# Patient Record
Sex: Female | Born: 1975 | Race: White | Hispanic: No | Marital: Married | Smoking: Current every day smoker
Health system: Southern US, Community
[De-identification: ages and names within clinical notes are randomized; demographics above are authoritative.]

## PROBLEM LIST (undated history)

## (undated) DIAGNOSIS — T7840XA Allergy, unspecified, initial encounter: Secondary | ICD-10-CM

## (undated) DIAGNOSIS — M329 Systemic lupus erythematosus, unspecified: Secondary | ICD-10-CM

## (undated) DIAGNOSIS — K760 Fatty (change of) liver, not elsewhere classified: Secondary | ICD-10-CM

## (undated) DIAGNOSIS — IMO0002 Reserved for concepts with insufficient information to code with codable children: Secondary | ICD-10-CM

## (undated) HISTORY — DX: Reserved for concepts with insufficient information to code with codable children: IMO0002

## (undated) HISTORY — PX: CARDIAC VALVE REPLACEMENT: SHX585

## (undated) HISTORY — DX: Systemic lupus erythematosus, unspecified: M32.9

## (undated) HISTORY — DX: Allergy, unspecified, initial encounter: T78.40XA

## (undated) HISTORY — DX: Fatty (change of) liver, not elsewhere classified: K76.0

---

## 1995-06-02 HISTORY — PX: SPLENECTOMY, TOTAL: SHX788

## 2004-05-29 ENCOUNTER — Emergency Department: Payer: Self-pay | Admitting: Emergency Medicine

## 2004-06-01 HISTORY — PX: ABDOMINAL HYSTERECTOMY: SHX81

## 2005-09-09 ENCOUNTER — Emergency Department: Payer: Self-pay | Admitting: Emergency Medicine

## 2006-02-11 ENCOUNTER — Emergency Department: Payer: Self-pay | Admitting: Emergency Medicine

## 2006-03-30 ENCOUNTER — Inpatient Hospital Stay: Payer: Self-pay

## 2006-04-07 ENCOUNTER — Ambulatory Visit: Payer: Self-pay | Admitting: Obstetrics & Gynecology

## 2006-07-18 ENCOUNTER — Emergency Department: Payer: Self-pay | Admitting: Emergency Medicine

## 2007-01-20 ENCOUNTER — Emergency Department: Payer: Self-pay | Admitting: Emergency Medicine

## 2007-01-20 ENCOUNTER — Other Ambulatory Visit: Payer: Self-pay

## 2007-03-21 ENCOUNTER — Emergency Department: Payer: Self-pay | Admitting: Emergency Medicine

## 2008-07-14 ENCOUNTER — Emergency Department: Payer: Self-pay | Admitting: Emergency Medicine

## 2008-07-16 ENCOUNTER — Emergency Department: Payer: Self-pay | Admitting: Emergency Medicine

## 2009-04-06 ENCOUNTER — Emergency Department: Payer: Self-pay | Admitting: Emergency Medicine

## 2009-11-24 ENCOUNTER — Ambulatory Visit: Payer: Self-pay | Admitting: Orthopedic Surgery

## 2014-07-03 LAB — LIPID PANEL
Cholesterol: 175 mg/dL (ref 0–200)
HDL: 84 mg/dL — AB (ref 35–70)
LDL Cholesterol: 79 mg/dL
Triglycerides: 62 mg/dL (ref 40–160)

## 2014-07-03 LAB — HEMOGLOBIN A1C: Hgb A1c MFr Bld: 5.4 % (ref 4.0–6.0)

## 2014-07-03 LAB — CBC AND DIFFERENTIAL
Platelets: 592 10*3/uL — AB (ref 150–399)
WBC: 11.7 10^3/mL

## 2014-07-06 ENCOUNTER — Ambulatory Visit: Payer: Self-pay | Admitting: Family Medicine

## 2014-09-18 ENCOUNTER — Encounter: Payer: Self-pay | Admitting: Family Medicine

## 2014-09-18 DIAGNOSIS — F1010 Alcohol abuse, uncomplicated: Secondary | ICD-10-CM | POA: Insufficient documentation

## 2014-09-18 DIAGNOSIS — Z800 Family history of malignant neoplasm of digestive organs: Secondary | ICD-10-CM | POA: Insufficient documentation

## 2014-09-18 DIAGNOSIS — E663 Overweight: Secondary | ICD-10-CM | POA: Insufficient documentation

## 2014-09-18 DIAGNOSIS — Z833 Family history of diabetes mellitus: Secondary | ICD-10-CM | POA: Insufficient documentation

## 2014-09-18 DIAGNOSIS — F17200 Nicotine dependence, unspecified, uncomplicated: Secondary | ICD-10-CM | POA: Insufficient documentation

## 2014-09-18 DIAGNOSIS — Z8709 Personal history of other diseases of the respiratory system: Secondary | ICD-10-CM | POA: Insufficient documentation

## 2014-09-18 DIAGNOSIS — K760 Fatty (change of) liver, not elsewhere classified: Secondary | ICD-10-CM | POA: Insufficient documentation

## 2014-11-19 ENCOUNTER — Encounter: Payer: Self-pay | Admitting: Family Medicine

## 2014-11-19 ENCOUNTER — Ambulatory Visit (INDEPENDENT_AMBULATORY_CARE_PROVIDER_SITE_OTHER): Payer: BLUE CROSS/BLUE SHIELD | Admitting: Family Medicine

## 2014-11-19 VITALS — BP 110/76 | HR 70 | Temp 98.3°F | Ht 65.0 in | Wt 182.4 lb

## 2014-11-19 DIAGNOSIS — R5383 Other fatigue: Secondary | ICD-10-CM | POA: Insufficient documentation

## 2014-11-19 DIAGNOSIS — J029 Acute pharyngitis, unspecified: Secondary | ICD-10-CM | POA: Diagnosis not present

## 2014-11-19 DIAGNOSIS — K760 Fatty (change of) liver, not elsewhere classified: Secondary | ICD-10-CM | POA: Diagnosis not present

## 2014-11-19 DIAGNOSIS — Z720 Tobacco use: Secondary | ICD-10-CM

## 2014-11-19 DIAGNOSIS — F17200 Nicotine dependence, unspecified, uncomplicated: Secondary | ICD-10-CM

## 2014-11-19 DIAGNOSIS — F1010 Alcohol abuse, uncomplicated: Secondary | ICD-10-CM | POA: Diagnosis not present

## 2014-11-19 DIAGNOSIS — E538 Deficiency of other specified B group vitamins: Secondary | ICD-10-CM | POA: Insufficient documentation

## 2014-11-19 DIAGNOSIS — R5382 Chronic fatigue, unspecified: Secondary | ICD-10-CM | POA: Diagnosis not present

## 2014-11-19 DIAGNOSIS — D72829 Elevated white blood cell count, unspecified: Secondary | ICD-10-CM

## 2014-11-19 DIAGNOSIS — E663 Overweight: Secondary | ICD-10-CM

## 2014-11-19 NOTE — Progress Notes (Signed)
Date:  11/19/2014   Name:  Victoria [name]   DOB:  05/28/1976   MRN:  [id number]  PCP:  Adline Potter, MD    Chief Complaint: Sore Throat   History of Present Illness:  This is a 39 y.o. female c/o sore throat past two days with fever and myalgias, slight rhinorrhea, hx strep. Quit ETOH for 11 days but started back, not drinking as much as before. Weight up 4#. B12 low last visit, taking supplement most days. Still smoking, not interested in quitting at present. C/o chronic fatigue, unsure why, sleeping ok.  Review of Systems:  Review of Systems  Patient Active Problem List   Diagnosis Date Noted  . Fatigue 11/19/2014  . Leukocytosis 11/19/2014  . Vitamin B12 deficiency 11/19/2014  . AA (alcohol abuse) 09/18/2014  . History of hay fever 09/18/2014  . Fatty infiltration of liver 09/18/2014  . Family history of malignant neoplasm of pancreas 09/18/2014  . Family history of diabetes mellitus type II 09/18/2014  . Overweight 09/18/2014  . Current smoker 09/18/2014    Prior to Admission medications   Medication Sig Start Date End Date Taking? Authorizing Provider  diphenhydrAMINE (BENADRYL) 25 mg capsule Take 1 capsule by mouth every 6 (six) hours as needed.   Yes Historical Provider, MD    Allergies  Allergen Reactions  . Oxycodone Hcl     No past surgical history on file.  History  Substance Use Topics  . Smoking status: Current Every Day Smoker  . Smokeless tobacco: Not on file     Comment: patient not ready to quit  . Alcohol Use: 0.0 oz/week    0 Standard drinks or equivalent per week    No family history on file.  Medication list has been reviewed and updated.  Physical Examination: BP 110/76 mmHg  Pulse 70  Temp(Src) 98.3 F (36.8 C)  Ht 5\' 5"  (1.651 m)  Wt 182 lb 6.4 oz (82.736 kg)  BMI 30.35 kg/m2  SpO2 99%  Physical Exam  Constitutional: She is oriented to person, place, and time. She appears well-developed and well-nourished. No distress.  HENT:   OP mild erythema, no exudate  Cardiovascular: Normal rate, regular rhythm and normal heart sounds.  Exam reveals no gallop.   No murmur heard. Pulmonary/Chest: Effort normal and breath sounds normal. She has no wheezes. She has no rales.  Abdominal: Soft. She exhibits no distension and no mass. There is no tenderness.  Lymphadenopathy:    She has no cervical adenopathy.  Neurological: She is alert and oriented to person, place, and time. Coordination normal.  Skin: Skin is warm and dry.  Psychiatric: She has a normal mood and affect. Her behavior is normal.    Assessment and Plan:  1. Pharyngitis RST negative, likely viral, expect resolution 3-5 days - POCT rapid strep A  2. Fatty infiltration of liver Recheck labs - Comprehensive Metabolic Panel (CMET)  3. AA (alcohol abuse) Encouraged full abstinence - Vitamin B1  4. Overweight Check labs, encouraged increased exercise, weight loss - TSH - Vitamin D (25 hydroxy)  5. Current smoker Advised quitting  6. Chronic fatigue Check labs - TSH - CBC - Vitamin B12  7. Leukocytosis Unclear etiology, sl improved last labs 2/16, recheck today - CBC  8. Vitamin B12 deficiency Recheck on supplementation - Vitamin B12  Return in about 4 weeks (around 12/17/2014).  [name]. [facility]  11/19/2014

## 2014-11-20 LAB — COMPREHENSIVE METABOLIC PANEL
A/G RATIO: 1.7 (ref 1.1–2.5)
ALBUMIN: 4.2 g/dL (ref 3.5–5.5)
ALK PHOS: 81 IU/L (ref 39–117)
ALT: 15 IU/L (ref 0–32)
AST: 19 IU/L (ref 0–40)
BUN / CREAT RATIO: 16 (ref 9–23)
BUN: 9 mg/dL (ref 6–24)
Bilirubin Total: 0.3 mg/dL (ref 0.0–1.2)
CO2: 20 mmol/L (ref 18–29)
Calcium: 9.1 mg/dL (ref 8.7–10.2)
Chloride: 99 mmol/L (ref 97–108)
Creatinine, Ser: 0.57 mg/dL (ref 0.57–1.00)
GFR calc Af Amer: 133 mL/min/{1.73_m2} (ref >59)
GFR, EST NON AFRICAN AMERICAN: 116 mL/min/{1.73_m2} (ref >59)
GLOBULIN, TOTAL: 2.5 g/dL (ref 1.5–4.5)
Glucose: 76 mg/dL (ref 65–99)
Potassium: 4.8 mmol/L (ref 3.5–5.2)
SODIUM: 138 mmol/L (ref 134–144)
Total Protein: 6.7 g/dL (ref 6.0–8.5)

## 2014-11-20 LAB — VITAMIN B12: Vitamin B-12: 262 pg/mL (ref 211–946)

## 2014-11-20 LAB — CBC
HEMATOCRIT: 44.1 % (ref 34.0–46.6)
Hemoglobin: 14.7 g/dL (ref 11.1–15.9)
MCH: 33.1 pg — AB (ref 26.6–33.0)
MCHC: 33.3 g/dL (ref 31.5–35.7)
MCV: 99 fL — AB (ref 79–97)
Platelets: 561 10*3/uL — ABNORMAL HIGH (ref 150–379)
RBC: 4.44 x10E6/uL (ref 3.77–5.28)
RDW: 13 % (ref 12.3–15.4)
WBC: 11.6 10*3/uL — ABNORMAL HIGH (ref 3.4–10.8)

## 2014-11-20 LAB — TSH: TSH: 2.04 u[IU]/mL (ref 0.450–4.500)

## 2014-11-21 ENCOUNTER — Other Ambulatory Visit: Payer: Self-pay | Admitting: Family Medicine

## 2014-11-21 LAB — VITAMIN B12: Vitamin B-12: 152

## 2014-11-22 LAB — VITAMIN D 25 HYDROXY (VIT D DEFICIENCY, FRACTURES): Vit D, 25-Hydroxy: 34.7 ng/mL (ref 30.0–100.0)

## 2014-11-23 LAB — SPECIMEN STATUS REPORT

## 2017-10-07 ENCOUNTER — Ambulatory Visit: Payer: BLUE CROSS/BLUE SHIELD | Admitting: Physician Assistant

## 2017-10-07 ENCOUNTER — Encounter: Payer: Self-pay | Admitting: Physician Assistant

## 2017-10-07 VITALS — BP 110/82 | HR 78 | Temp 98.3°F | Resp 16 | Ht 65.0 in | Wt 166.0 lb

## 2017-10-07 DIAGNOSIS — F1010 Alcohol abuse, uncomplicated: Secondary | ICD-10-CM

## 2017-10-07 DIAGNOSIS — E538 Deficiency of other specified B group vitamins: Secondary | ICD-10-CM

## 2017-10-07 DIAGNOSIS — R410 Disorientation, unspecified: Secondary | ICD-10-CM

## 2017-10-07 DIAGNOSIS — F17200 Nicotine dependence, unspecified, uncomplicated: Secondary | ICD-10-CM | POA: Diagnosis not present

## 2017-10-07 DIAGNOSIS — N951 Menopausal and female climacteric states: Secondary | ICD-10-CM | POA: Diagnosis not present

## 2017-10-07 DIAGNOSIS — Z833 Family history of diabetes mellitus: Secondary | ICD-10-CM

## 2017-10-07 DIAGNOSIS — M329 Systemic lupus erythematosus, unspecified: Secondary | ICD-10-CM | POA: Diagnosis not present

## 2017-10-07 DIAGNOSIS — IMO0002 Reserved for concepts with insufficient information to code with codable children: Secondary | ICD-10-CM | POA: Insufficient documentation

## 2017-10-07 DIAGNOSIS — Z90710 Acquired absence of both cervix and uterus: Secondary | ICD-10-CM | POA: Diagnosis not present

## 2017-10-07 DIAGNOSIS — K760 Fatty (change of) liver, not elsewhere classified: Secondary | ICD-10-CM | POA: Diagnosis not present

## 2017-10-07 NOTE — Progress Notes (Signed)
Patient: Victoria [name], Female    DOB: 10-26-1975, 42 y.o.   MRN: [id number] Visit Date: 10/11/2017  Today's Provider: Mar Daring, [name]   Chief Complaint  Patient presents with  . New Patient (Initial Visit)   Subjective:   Patient here to establish care, pt last seen by Dr. Vicente Masson, notes in [place].   Patient reports she was diagnosed with Lupus by Dr. Aubery Lapping in 12/2016. Patient reports decreased concentration x's several months report symptoms are worsening.  She also has a history of mild fatty liver noted on US abdomen from 07/06/14. She reports at the time she was having RUQ pain and they wanted to R/O gallstones.   -----------------------------------------------------------------   Review of Systems  Constitutional: Positive for fatigue.  HENT: Positive for congestion and sinus pressure.   Eyes: Positive for photophobia.  Respiratory: Positive for cough and shortness of breath.   Gastrointestinal: Positive for nausea.  Endocrine: Negative.   Genitourinary: Negative.   Musculoskeletal: Positive for arthralgias.  Skin: Negative.   Allergic/Immunologic: Negative.   Neurological: Positive for light-headedness and numbness.  Psychiatric/Behavioral: Positive for confusion and decreased concentration.    Social History      She  reports that she has been smoking cigarettes.  She has a 27.00 pack-year smoking history. She has never used smokeless tobacco. She reports that she drinks about 6.0 oz of alcohol per week. She reports that she does not use drugs.       Social History   Socioeconomic History  . Marital status: Married    Spouse name: Not on file  . Number of children: Not on file  . Years of education: Not on file  . Highest education level: Not on file  Occupational History  . Not on file  Social Needs  . Financial resource strain: Not on file  . Food insecurity:    Worry: Not on file    Inability: Not on file  .  Transportation needs:    Medical: Not on file    Non-medical: Not on file  Tobacco Use  . Smoking status: Current Every Day Smoker    Packs/day: 1.00    Years: 27.00    Pack years: 27.00    Types: Cigarettes  . Smokeless tobacco: Never Used  . Tobacco comment: patient not ready to quit  Substance and Sexual Activity  . Alcohol use: Yes    Alcohol/week: 6.0 oz    Types: 10 Glasses of wine per week  . Drug use: No  . Sexual activity: Not on file  Lifestyle  . Physical activity:    Days per week: Not on file    Minutes per session: Not on file  . Stress: Not on file  Relationships  . Social connections:    Talks on phone: Not on file    Gets together: Not on file    Attends religious service: Not on file    Active member of club or organization: Not on file    Attends meetings of clubs or organizations: Not on file    Relationship status: Not on file  Other Topics Concern  . Not on file  Social History Narrative  . Not on file    Past Medical History:  Diagnosis Date  . Allergy   . Fatty liver   . Lupus [facility])      Patient Active Problem List   Diagnosis Date Noted  . Lupus ([place]) 10/07/2017  . Fatigue 11/19/2014  .  Leukocytosis 11/19/2014  . Vitamin B12 deficiency 11/19/2014  . AA (alcohol abuse) 09/18/2014  . History of hay fever 09/18/2014  . Fatty infiltration of liver 09/18/2014  . Family history of malignant neoplasm of pancreas 09/18/2014  . Family history of diabetes mellitus type II 09/18/2014  . Overweight 09/18/2014  . Current smoker 09/18/2014    Past Surgical History:  Procedure Laterality Date  . ABDOMINAL HYSTERECTOMY  2006   still has ovaries  . CARDIAC VALVE REPLACEMENT    . SPLENECTOMY, TOTAL  1997    Family History        Family Status  Relation Name Status  . Mother  Alive  . Father  Deceased at age 42  . MGM  (Not Specified)  . PGM  (Not Specified)  . PGF  (Not Specified)  . Brother  Alive        Her family history includes  Diabetes in her maternal grandmother and mother; Heart disease in her maternal grandmother, paternal grandfather, and paternal grandmother; Hypertension in her mother; Liver disease in her brother; Pancreatic cancer in her father.      Allergies  Allergen Reactions  . Oxycodone Hcl      Current Outpatient Medications:  .  diphenhydrAMINE (BENADRYL) 25 mg capsule, Take 1 capsule by mouth every 6 (six) hours as needed., Disp: , Rfl:    Patient Care Team: Mar Daring, [name] as PCP - General (Family Medicine)      Objective:   Vitals: BP 110/82 (BP Location: Left Arm, Patient Position: Sitting, Cuff Size: Normal)   Pulse 78   Temp 98.3 F (36.8 C) (Oral)   Resp 16   Ht 5\' 5"  (1.651 m)   Wt 166 lb (75.3 kg)   SpO2 99%   BMI 27.62 kg/m    Vitals:   10/07/17 1029  BP: 110/82  Pulse: 78  Resp: 16  Temp: 98.3 F (36.8 C)  TempSrc: Oral  SpO2: 99%  Weight: 166 lb (75.3 kg)  Height: 5\' 5"  (1.651 m)     Physical Exam  Constitutional: She is oriented to person, place, and time. She appears well-developed and well-nourished. No distress.  HENT:  Head: Normocephalic and atraumatic.  Right Ear: External ear normal.  Left Ear: External ear normal.  Nose: Nose normal.  Mouth/Throat: Oropharynx is clear and moist. No oropharyngeal exudate.  Eyes: Pupils are equal, round, and reactive to light. Conjunctivae and EOM are normal. Right eye exhibits no discharge. Left eye exhibits no discharge. No scleral icterus.  Neck: Normal range of motion. Neck supple. No JVD present. No tracheal deviation present. No thyromegaly present.  Cardiovascular: Normal rate, regular rhythm, normal heart sounds and intact distal pulses. Exam reveals no gallop and no friction rub.  No murmur heard. Pulmonary/Chest: Effort normal and breath sounds normal. No respiratory distress. She has no wheezes. She has no rales. She exhibits no tenderness.  Abdominal: Soft. Bowel sounds are normal. She exhibits  no distension and no mass. There is no tenderness. There is no rebound and no guarding.  Musculoskeletal: Normal range of motion. She exhibits no edema or tenderness.  Lymphadenopathy:    She has no cervical adenopathy.  Neurological: She is alert and oriented to person, place, and time.  Skin: Skin is warm and dry. No rash noted. She is not diaphoretic.  Psychiatric: She has a normal mood and affect. Her behavior is normal. Judgment and thought content normal.  Vitals reviewed.    Depression Screen [facility] 2/9  Scores 10/07/2017  PHQ - 2 Score 1  PHQ- 9 Score 5      Assessment & Plan:     Routine Health Maintenance and Physical Exam  Exercise Activities and Dietary recommendations Goals    None      Immunization History  Administered Date(s) Administered  . DTaP 07/03/2014  . Tdap 07/03/2014    Health Maintenance  Topic Date Due  . HIV Screening  06/10/1988  . PAP SMEAR  06/10/1994  . INFLUENZA VACCINE  12/30/2017  . TETANUS/TDAP  07/03/2024     Discussed health benefits of physical activity, and encouraged her to engage in regular exercise appropriate for her age and condition.    1. Fatty infiltration of liver Will recheck labs as they had not been checked since 2016. Also discussed with patient dietary changes that will help slow progression of fatty liver disease. Patient also continues to drink alcohol , 3-4 glasses of wine nightly. Discussed limiting alcohol consumption as well. I will check labs and f/u pending results.  - Comprehensive Metabolic Panel (CMET) - Lipid Profile  2. AA (alcohol abuse) See above medical treatment plan. - Comprehensive Metabolic Panel (CMET)  3. Family history of diabetes mellitus type II Strong family history. Will check labs as below for screening purposes.  - HgB A1c  4. Current smoker Discussed smoking cessation for approx 5-7 minutes during visit. She is interested but is currently using nicotene gum prn.  5. Vitamin B12  deficiency H/O this. Last check was normal, but low (less than 300). Will recheck to see where she is currently as this could cause some of her symptoms she is experiencing. - B12  6. Menopausal and female climacteric states Patient had hysterectomy. Having some menopausal symptoms. Will check labs and see if she may be menopausal.  - FSH/LH - Estrogens, total  7. S/P hysterectomy See above medical treatment plan. - FSH/LH - Estrogens, total  8. Confusion See above medical treatment plan. - CBC w/Diff/Platelet - Comprehensive Metabolic Panel (CMET) - TSH  9. Lupus ([place]) Will check labs and once results received from Dr. Phillip Heal will refer to Rheumatology. - CBC w/Diff/Platelet - Comprehensive Metabolic Panel (CMET)  --------------------------------------------------------------------    Mar Daring, [name]  [facility]

## 2017-10-07 NOTE — Patient Instructions (Signed)
Systemic Lupus Erythematosus, Adult Systemic lupus erythematosus is a long-term (chronic) disease that can affect many parts of the body. It can damage the skin, joints, blood vessels, brain, kidneys, lungs, heart, and other internal organs. It causes pain, irritation, and inflammation. Systemic lupus erythematosus is an autoimmune disease. With this type of disease, the body's defense system (immune system) mistakenly attacks normal tissues instead of attacking germs or abnormal growths. What are the causes? The cause of this condition is not known. What increases the risk? This condition is more likely to develop in:  Females.  People of Asian descent.  People of African-American descent.  People who have a family history of the condition.  What are the signs or symptoms? General symptoms include:  Joint pain and swelling (common).  Fever.  Fatigue.  Unusual weight loss or weight gain.  Skin rashes, especially over the nose and cheeks (butterfly rash) and after sun exposure.  Sores inside the mouth or nose.  Other symptoms depend on which parts of the body are affected. They can include:  Shortness of breath.  Chest pain.  Frequent urination.  Blood in the urine.  Seizures.  Mental changes.  Hair loss.  Swollen and tender lymph nodes.  Swelling of the hands or feet.  Symptoms can come and go. A period of time when symptoms get worse or come back is called a flare. A period of time with no symptoms is called a remission. How is this diagnosed? This condition is diagnosed based on symptoms, a medical history, and a physical exam. You may also have tests, including:  Blood tests.  Urine tests.  A chest X-ray.  A skin or kidney biopsy. For this test, a sample of tissue is taken from the skin or kidney and studied under a microscope.  You may be referred to an autoimmune disease specialist (rheumatologist). How is this treated? There is no cure for this  condition, but treatment can keep the disease in remission, help to control symptoms, and prevent damage to the heart, lungs, kidneys, and other organs. Treatment may involve taking a combination of medicines over time. Follow these instructions at home: Medicines  Take medicines only as directed by your health care provider.  Do not take any medicines that contain estrogen without first checking with your health care provider. Estrogen can trigger flares and may increase your risk for blood clots. Lifestyle  Eat a heart-healthy diet.  Stay active as directed by your health care provider.  Do not smoke. If you need help quitting, ask your health care provider.  Protect your skin from the sun by applying sunblock and wearing protective hats and clothing.  Learn as much as you can about your condition and have a good support system in place. Support may come from family, friends, or a lupus support group. General instructions  Keep all follow-up visits as directed by your health care provider. This is important.  Work closely with all of your health care providers to manage your condition.  Let your health care provider know right away if you become pregnant or if you plan to become pregnant. Pregnancy in women with this condition is considered high risk. Contact a health care provider if:  You have a fever.  Your symptoms flare.  You develop new symptoms.  You develop swollen feet or hands.  You develop puffiness around your eyes.  Your medicines are not working.  You have bloody, foamy, or coffee-colored urine.  There are changes in your   urination. For example, you urinate more often at night.  You think that you may be depressed or have anxiety. Get help right away if:  You have chest pain.  You have trouble breathing.  You have a seizure.  You suddenly get a very bad headache.  You suddenly develop facial or body weakness.  You cannot speak.  You cannot  understand speech. This information is not intended to replace advice given to you by your health care provider. Make sure you discuss any questions you have with your health care provider. Document Released: 05/08/2002 Document Revised: 01/12/2016 Document Reviewed: 04/25/2014 Elsevier Interactive Patient Education  2018 Elsevier Inc.  

## 2017-10-13 LAB — FSH/LH
FSH: 27.5 m[IU]/mL
LH: 27.1 m[IU]/mL

## 2017-10-13 LAB — COMPREHENSIVE METABOLIC PANEL
A/G RATIO: 2 (ref 1.2–2.2)
ALT: 22 IU/L (ref 0–32)
AST: 18 IU/L (ref 0–40)
Albumin: 4.2 g/dL (ref 3.5–5.5)
Alkaline Phosphatase: 48 IU/L (ref 39–117)
BUN/Creatinine Ratio: 19 (ref 9–23)
BUN: 12 mg/dL (ref 6–24)
Bilirubin Total: <0.2 mg/dL (ref 0.0–1.2)
CALCIUM: 9.3 mg/dL (ref 8.7–10.2)
CO2: 18 mmol/L — AB (ref 20–29)
CREATININE: 0.63 mg/dL (ref 0.57–1.00)
Chloride: 108 mmol/L — ABNORMAL HIGH (ref 96–106)
GFR, EST AFRICAN AMERICAN: 126 mL/min/{1.73_m2} (ref >59)
GFR, EST NON AFRICAN AMERICAN: 109 mL/min/{1.73_m2} (ref >59)
GLOBULIN, TOTAL: 2.1 g/dL (ref 1.5–4.5)
Glucose: 116 mg/dL — ABNORMAL HIGH (ref 65–99)
POTASSIUM: 4.9 mmol/L (ref 3.5–5.2)
Sodium: 140 mmol/L (ref 134–144)
TOTAL PROTEIN: 6.3 g/dL (ref 6.0–8.5)

## 2017-10-13 LAB — CBC WITH DIFFERENTIAL/PLATELET
BASOS ABS: 0 10*3/uL (ref 0.0–0.2)
Basos: 0 %
EOS (ABSOLUTE): 0.4 10*3/uL (ref 0.0–0.4)
Eos: 4 %
Hematocrit: 41 % (ref 34.0–46.6)
Hemoglobin: 13.9 g/dL (ref 11.1–15.9)
IMMATURE GRANS (ABS): 0 10*3/uL (ref 0.0–0.1)
IMMATURE GRANULOCYTES: 0 %
LYMPHS: 31 %
Lymphocytes Absolute: 3 10*3/uL (ref 0.7–3.1)
MCH: 34.1 pg — ABNORMAL HIGH (ref 26.6–33.0)
MCHC: 33.9 g/dL (ref 31.5–35.7)
MCV: 101 fL — ABNORMAL HIGH (ref 79–97)
Monocytes Absolute: 1.3 10*3/uL — ABNORMAL HIGH (ref 0.1–0.9)
Monocytes: 14 %
NEUTROS PCT: 51 %
Neutrophils Absolute: 4.8 10*3/uL (ref 1.4–7.0)
PLATELETS: 636 10*3/uL — AB (ref 150–379)
RBC: 4.08 x10E6/uL (ref 3.77–5.28)
RDW: 13.5 % (ref 12.3–15.4)
WBC: 9.5 10*3/uL (ref 3.4–10.8)

## 2017-10-13 LAB — LIPID PANEL
CHOL/HDL RATIO: 2.4 ratio (ref 0.0–4.4)
Cholesterol, Total: 166 mg/dL (ref 100–199)
HDL: 69 mg/dL (ref >39)
LDL CALC: 87 mg/dL (ref 0–99)
Triglycerides: 51 mg/dL (ref 0–149)
VLDL Cholesterol Cal: 10 mg/dL (ref 5–40)

## 2017-10-13 LAB — HEMOGLOBIN A1C
Est. average glucose Bld gHb Est-mCnc: 97 mg/dL
Hgb A1c MFr Bld: 5 % (ref 4.8–5.6)

## 2017-10-13 LAB — TSH: TSH: 2.49 u[IU]/mL (ref 0.450–4.500)

## 2017-10-13 LAB — VITAMIN B12: VITAMIN B 12: 297 pg/mL (ref 232–1245)

## 2017-10-13 LAB — ESTROGENS, TOTAL: ESTROGEN: 190 pg/mL

## 2017-10-14 ENCOUNTER — Telehealth: Payer: Self-pay

## 2017-10-14 ENCOUNTER — Encounter: Payer: Self-pay | Admitting: Physician Assistant

## 2017-10-14 DIAGNOSIS — F320 Major depressive disorder, single episode, mild: Secondary | ICD-10-CM

## 2017-10-14 DIAGNOSIS — M329 Systemic lupus erythematosus, unspecified: Secondary | ICD-10-CM

## 2017-10-14 MED ORDER — SERTRALINE HCL 50 MG PO TABS: 50.0000 mg | ORAL_TABLET | Freq: Every day | ORAL | 0 refills | Status: DC

## 2017-10-14 NOTE — Telephone Encounter (Signed)
Patient advised as below. Patient verbalizes understanding and is in agreement with treatment plan.  

## 2017-10-14 NOTE — Telephone Encounter (Signed)
-----   Message from Mar Daring, [name] sent at 10/14/2017 10:58 AM EDT ----- All labs are normal and stable. B12 is still borderline low. Recommend OTC B12 supplement. Random sugar was a little elevated but A1c is normal at 5.0 so no diabetes. Female hormones are consistent with you still ovulating. Not menopausal yet. No cause of symptoms identified on labs

## 2017-11-09 ENCOUNTER — Ambulatory Visit: Payer: BLUE CROSS/BLUE SHIELD | Admitting: Physician Assistant

## 2017-11-09 ENCOUNTER — Encounter: Payer: Self-pay | Admitting: Physician Assistant

## 2017-11-09 DIAGNOSIS — R7989 Other specified abnormal findings of blood chemistry: Secondary | ICD-10-CM

## 2017-11-09 DIAGNOSIS — F320 Major depressive disorder, single episode, mild: Secondary | ICD-10-CM

## 2017-11-09 DIAGNOSIS — Z9081 Acquired absence of spleen: Secondary | ICD-10-CM | POA: Diagnosis not present

## 2017-11-09 DIAGNOSIS — D75838 Other thrombocytosis: Secondary | ICD-10-CM | POA: Insufficient documentation

## 2017-11-09 MED ORDER — SERTRALINE HCL 50 MG PO TABS: 50.0000 mg | ORAL_TABLET | Freq: Every day | ORAL | 5 refills | Status: DC

## 2017-11-09 NOTE — Progress Notes (Signed)
       Patient: Victoria [name] Female    DOB: Oct 04, 1975   42 y.o.   MRN: [id number] Visit Date: 11/09/2017  Today's Provider: Mar Daring, [name]   Chief Complaint  Patient presents with  . Depression   Subjective:    HPI   Follow up for depression  The patient was last seen for this 4 weeks ago. Changes made at last visit include start Sertraline 50 mg at bedtime may increase to 2 tablets at bedtime if needed.  She reports excellent compliance with treatment. She feels that condition is Improved. She is not having side effects.  ------------------------------------------------------------------------------------    Allergies  Allergen Reactions  . Oxycodone Hcl      Current Outpatient Medications:  .  diphenhydrAMINE (BENADRYL) 25 mg capsule, Take 1 capsule by mouth every 6 (six) hours as needed., Disp: , Rfl:  .  sertraline (ZOLOFT) 50 MG tablet, Take 1 tablet (50 mg total) by mouth at bedtime. May increase to 2 tabs PO q hs if needed, Disp: 60 tablet, Rfl: 0  Review of Systems  Constitutional: Positive for appetite change and fatigue.  Respiratory: Negative.   Cardiovascular: Negative.   Gastrointestinal: Negative.   Psychiatric/Behavioral: Positive for decreased concentration. The patient is nervous/anxious.     Social History   Tobacco Use  . Smoking status: Current Every Day Smoker    Packs/day: 1.00    Years: 27.00    Pack years: 27.00    Types: Cigarettes  . Smokeless tobacco: Never Used  . Tobacco comment: patient not ready to quit  Substance Use Topics  . Alcohol use: Yes    Alcohol/week: 6.0 oz    Types: 10 Glasses of wine per week   Objective:   BP 110/80 (BP Location: Left Arm, Patient Position: Sitting, Cuff Size: Normal)   Pulse 72   Temp 98.2 F (36.8 C) (Oral)   Resp 16   Wt 158 lb (71.7 kg)   SpO2 96%   BMI 26.29 kg/m  Vitals:   11/09/17 0844  BP: 110/80  Pulse: 72  Resp: 16  Temp: 98.2 F (36.8 C)    TempSrc: Oral  SpO2: 96%  Weight: 158 lb (71.7 kg)     Physical Exam  Constitutional: She appears well-developed and well-nourished. No distress.  Neck: Normal range of motion. Neck supple.  Cardiovascular: Normal rate, regular rhythm and normal heart sounds. Exam reveals no gallop and no friction rub.  No murmur heard. Pulmonary/Chest: Effort normal and breath sounds normal. No respiratory distress. She has no wheezes. She has no rales.  Musculoskeletal: She exhibits no edema.  Skin: She is not diaphoretic.  Psychiatric: She has a normal mood and affect. Her behavior is normal. Judgment and thought content normal.  Vitals reviewed.       Assessment & Plan:     1. S/P splenectomy Suspect thrombocytosis from splenectomy. Will recheck labs as below for stability since patient is new to my care.  - CBC w/Diff/Platelet  2. 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 - CBC w/Diff/Platelet  3. Depression, major, single episode, mild (HCC) Improved. Diagnosis pulled for medication refill. Continue current medical treatment plan. - sertraline (ZOLOFT) 50 MG tablet; Take 1 tablet (50 mg total) by mouth at bedtime. May increase to 2 tabs PO q hs if needed  Dispense: 60 tablet; Refill: [place], [name]  [facility]

## 2017-11-09 NOTE — Patient Instructions (Signed)
Reactive Thrombocytosis Reactive thrombocytosis is when you have too many platelets (thrombocytes) in your blood. Platelets are tiny elements in the blood that stick together and form a clot (thrombus). Platelets help your body stop bleeding. Conditions that cause inflammation, such as cancer, may trigger your body to make more platelets than normal. This condition may also be called secondary thrombocytosis. What are the causes? Many things can cause this condition, including:  Having your spleen surgically removed (splenectomy).  Injury (trauma).  Certain infections.  Very bad bleeding.  Low red blood cell count from not having enough iron (iron deficiency anemia).  Having a disease that destroys your red blood cells (hemolytic anemia).  Not having enough vitamin B-12.  Inflammatory bowel disease (Crohn disease or ulcerative colitis).  Cancer, especially lymphoma, breast, stomach, and ovarian cancers.  Alcohol abuse.  Certain medicines.  What are the signs or symptoms?  It can be hard to tell the difference between symptoms of reactive thrombocytosis and symptoms of the underlying condition. If you have symptoms, they may include:  Weakness.  Headache.  Dizziness or confusion.  Chest pain or shortness of breath.  Tingling or burning in your hands or feet.  How is this diagnosed? This condition may be diagnosed based on routine blood tests or while you are being evaluated for another condition. You may also need tests to confirm the diagnosis. These may include:  Additionalblood tests.  A procedure to collect a sample of bone marrow (bone marrow aspiration).  How is this treated? Treatment for this condition depends on the cause. Your platelet count may return to normal after the underlying cause is treated. If your platelet count is very high, you may have to take medicine to prevent blood clots as told by your health care provider. Follow these instructions at  home:  Take over-the-counter and prescription medicines only as told by your health care provider.  Work with your health care provider to: ? Treat the condition causing reactive thrombocytosis. ? Control any other conditions you may have, such as high blood pressure, high cholesterol, and diabetes.  Do not smoke. If you need help quitting, talk to your health care provider.  Keep all follow-up visits as told by your health care provider. This is important. Contact a health care provider if:  You have a headache that you cannot control.  You faint. Get help right away if:  You suddenly develop a headache, weakness, or drooping in your face.  You suddenly cannot speak or understand speech.  You have chest pain.  You have trouble breathing. This information is not intended to replace advice given to you by your health care provider. Make sure you discuss any questions you have with your health care provider. Document Released: 02/06/2015 Document Revised: 10/24/2015 Document Reviewed: 08/22/2014 Elsevier Interactive Patient Education  [employer].

## 2017-11-10 ENCOUNTER — Telehealth: Payer: Self-pay

## 2017-11-10 LAB — CBC WITH DIFFERENTIAL/PLATELET
Basophils Absolute: 0 10*3/uL (ref 0.0–0.2)
Basos: 0 %
EOS (ABSOLUTE): 0.2 10*3/uL (ref 0.0–0.4)
EOS: 2 %
Hematocrit: 42.6 % (ref 34.0–46.6)
Hemoglobin: 14.7 g/dL (ref 11.1–15.9)
IMMATURE GRANULOCYTES: 0 %
Immature Grans (Abs): 0 10*3/uL (ref 0.0–0.1)
Lymphocytes Absolute: 2.7 10*3/uL (ref 0.7–3.1)
Lymphs: 25 %
MCH: 34.8 pg — ABNORMAL HIGH (ref 26.6–33.0)
MCHC: 34.5 g/dL (ref 31.5–35.7)
MCV: 101 fL — ABNORMAL HIGH (ref 79–97)
MONOS ABS: 0.9 10*3/uL (ref 0.1–0.9)
Monocytes: 9 %
NEUTROS PCT: 64 %
Neutrophils Absolute: 6.8 10*3/uL (ref 1.4–7.0)
Platelets: 618 10*3/uL — ABNORMAL HIGH (ref 150–450)
RBC: 4.22 x10E6/uL (ref 3.77–5.28)
RDW: 13.2 % (ref 12.3–15.4)
WBC: 10.6 10*3/uL (ref 3.4–10.8)

## 2017-11-10 NOTE — Telephone Encounter (Signed)
Viewed by Roxanna Mew on 11/10/2017 8:37 AM

## 2017-11-10 NOTE — Telephone Encounter (Signed)
-----   Message from Mar Daring, [name] sent at 11/10/2017  8:28 AM EDT ----- Platelets stable at 618.

## 2017-12-16 ENCOUNTER — Ambulatory Visit (INDEPENDENT_AMBULATORY_CARE_PROVIDER_SITE_OTHER): Payer: BLUE CROSS/BLUE SHIELD | Admitting: Physician Assistant

## 2017-12-16 ENCOUNTER — Encounter: Payer: Self-pay | Admitting: Physician Assistant

## 2017-12-16 VITALS — BP 120/80 | HR 59 | Temp 98.5°F | Resp 16 | Ht 65.0 in | Wt 156.6 lb

## 2017-12-16 DIAGNOSIS — Z6826 Body mass index (BMI) 26.0-26.9, adult: Secondary | ICD-10-CM

## 2017-12-16 DIAGNOSIS — Z0000 Encounter for general adult medical examination without abnormal findings: Secondary | ICD-10-CM | POA: Diagnosis not present

## 2017-12-16 DIAGNOSIS — Z1231 Encounter for screening mammogram for malignant neoplasm of breast: Secondary | ICD-10-CM | POA: Diagnosis not present

## 2017-12-16 DIAGNOSIS — Z1239 Encounter for other screening for malignant neoplasm of breast: Secondary | ICD-10-CM

## 2017-12-16 NOTE — Progress Notes (Signed)
Patient: Victoria [name], Female    DOB: 1975-08-11, 42 y.o.   MRN: [id number] Visit Date: 12/16/2017  Today's Provider: Mar Daring, [name]   Chief Complaint  Patient presents with  . Annual Exam   Subjective:    Annual physical exam Victoria [name] is a 42 y.o. female who presents today for health maintenance and complete physical. She feels well. She reports exercising. She reports she is sleeping well. -----------------------------------------------------------------   Review of Systems  Constitutional: Positive for fatigue.  HENT: Positive for sinus pressure.   Eyes: Negative.   Respiratory: Positive for cough and shortness of breath.   Cardiovascular: Negative.   Gastrointestinal: Negative.   Endocrine: Positive for polyuria.  Genitourinary: Negative.   Musculoskeletal: Positive for arthralgias.  Skin: Negative.   Allergic/Immunologic: Positive for immunocompromised state.  Neurological: Positive for headaches.  Hematological: Negative.   Psychiatric/Behavioral: Positive for confusion and decreased concentration.    Social History      She  reports that she has been smoking cigarettes.  She has a 27.00 pack-year smoking history. She has never used smokeless tobacco. She reports that she drinks about 6.0 oz of alcohol per week. She reports that she does not use drugs.       Social History   Socioeconomic History  . Marital status: Married    Spouse name: Not on file  . Number of children: Not on file  . Years of education: Not on file  . Highest education level: Not on file  Occupational History  . Not on file  Social Needs  . Financial resource strain: Not on file  . Food insecurity:    Worry: Not on file    Inability: Not on file  . Transportation needs:    Medical: Not on file    Non-medical: Not on file  Tobacco Use  . Smoking status: Current Every Day Smoker    Packs/day: 1.00    Years: 27.00    Pack years: 27.00    Types: Cigarettes  . Smokeless tobacco: Never Used  . Tobacco comment: patient not ready to quit  Substance and Sexual Activity  . Alcohol use: Yes    Alcohol/week: 6.0 oz    Types: 10 Glasses of wine per week  . Drug use: No  . Sexual activity: Not on file  Lifestyle  . Physical activity:    Days per week: Not on file    Minutes per session: Not on file  . Stress: Not on file  Relationships  . Social connections:    Talks on phone: Not on file    Gets together: Not on file    Attends religious service: Not on file    Active member of club or organization: Not on file    Attends meetings of clubs or organizations: Not on file    Relationship status: Not on file  Other Topics Concern  . Not on file  Social History Narrative  . Not on file    Past Medical History:  Diagnosis Date  . Allergy   . Fatty liver   . Lupus [facility])      Patient Active Problem List   Diagnosis Date Noted  . 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 Lupus ([place]) 10/07/2017  . Fatigue 11/19/2014  . Leukocytosis 11/19/2014  . Vitamin B12 deficiency 11/19/2014  . AA (alcohol abuse) 09/18/2014  . History of hay fever 09/18/2014  . Fatty infiltration of  liver 09/18/2014  . Family history of malignant neoplasm of pancreas 09/18/2014  . Family history of diabetes mellitus type II 09/18/2014  . Overweight 09/18/2014  . Current smoker 09/18/2014    Past Surgical History:  Procedure Laterality Date  . ABDOMINAL HYSTERECTOMY  2006   still has ovaries  . CARDIAC VALVE REPLACEMENT    . SPLENECTOMY, TOTAL  1997    Family History        Family Status  Relation Name Status  . Mother  Alive  . Father  Deceased at age 17  . MGM  (Not Specified)  . PGM  (Not Specified)  . PGF  (Not Specified)  . Brother  Alive        Her family history includes Diabetes in her maternal grandmother and mother; Heart disease in her maternal grandmother, paternal grandfather, and  paternal grandmother; Hypertension in her mother; Liver disease in her brother; Pancreatic cancer in her father.      Allergies  Allergen Reactions  . Oxycodone Hcl      Current Outpatient Medications:  .  diphenhydrAMINE (BENADRYL) 25 mg capsule, Take 1 capsule by mouth every 6 (six) hours as needed., Disp: , Rfl:  .  sertraline (ZOLOFT) 50 MG tablet, Take 1 tablet (50 mg total) by mouth at bedtime. May increase to 2 tabs PO q hs if needed, Disp: 60 tablet, Rfl: 5   Patient Care Team: Mar Daring, [name] as PCP - General (Family Medicine)      Objective:   Vitals: BP 120/80 (BP Location: Left Arm, Patient Position: Sitting, Cuff Size: Normal)   Pulse (!) 59   Temp 98.5 F (36.9 C) (Oral)   Resp 16   Ht 5\' 5"  (1.651 m)   Wt 156 lb 9.6 oz (71 kg)   BMI 26.06 kg/m    Vitals:   12/16/17 0855  BP: 120/80  Pulse: (!) 59  Resp: 16  Temp: 98.5 F (36.9 C)  TempSrc: Oral  Weight: 156 lb 9.6 oz (71 kg)  Height: 5\' 5"  (1.651 m)     Physical Exam  Constitutional: She is oriented to person, place, and time. She appears well-developed and well-nourished.  HENT:  Head: Normocephalic.  Right Ear: Hearing, tympanic membrane, external ear and ear canal normal.  Left Ear: Hearing, tympanic membrane, external ear and ear canal normal.  Nose: Nose normal.  Mouth/Throat: Uvula is midline, oropharynx is clear and moist and mucous membranes are normal.  Eyes: Pupils are equal, round, and reactive to light. Conjunctivae and EOM are normal.  Neck: Normal range of motion. Neck supple. Carotid bruit is not present.  Cardiovascular: Normal rate, regular rhythm, normal heart sounds and intact distal pulses.  Pulmonary/Chest: Effort normal and breath sounds normal. Right breast exhibits no inverted nipple, no mass, no nipple discharge, no skin change and no tenderness. Left breast exhibits no inverted nipple, no mass, no nipple discharge, no skin change and no tenderness. No breast  swelling, tenderness, discharge or bleeding. Breasts are symmetrical.  Abdominal: Soft. Bowel sounds are normal.  Musculoskeletal: Normal range of motion.  Neurological: She is alert and oriented to person, place, and time.  Skin: Skin is warm.  Psychiatric: She has a normal mood and affect. Her behavior is normal. Judgment and thought content normal.  Vitals reviewed.    Depression Screen PHQ 2/9 Scores 12/16/2017 11/09/2017 10/07/2017  PHQ - 2 Score 0 1 1  PHQ- 9 Score 4 7 5       Assessment &  Plan:     Routine Health Maintenance and Physical Exam  Exercise Activities and Dietary recommendations Goals    None      Immunization History  Administered Date(s) Administered  . DTaP 07/03/2014  . Tdap 07/03/2014    Health Maintenance  Topic Date Due  . HIV Screening  06/10/1988  . PAP SMEAR  06/10/1994  . INFLUENZA VACCINE  12/30/2017  . TETANUS/TDAP  07/03/2024     Discussed health benefits of physical activity, and encouraged her to engage in regular exercise appropriate for her age and condition.    1. Annual physical exam Normal physical exam today. Labs normal in 10/11/17. Up to date on vaccinations.   2. Screening for breast cancer Breast exam today was normal. There is no family history of breast cancer. She does perform regular self breast exams. Mammogram was ordered as below. Information for [facility] Breast clinic was given to patient so she may schedule her mammogram at her convenience. - MM DIGITAL SCREENING BILATERAL; Future  3. BMI 26.0-26.9,adult Counseled patient on healthy lifestyle modifications including dieting and exercise.   --------------------------------------------------------------------    Mar Daring, [name]  [facility]

## 2017-12-16 NOTE — Patient Instructions (Signed)

## 2018-01-06 ENCOUNTER — Ambulatory Visit
Admission: RE | Admit: 2018-01-06 | Discharge: 2018-01-06 | Disposition: A | Discharge status: Home / Self Care | Payer: BLUE CROSS/BLUE SHIELD | Source: Ambulatory Visit | Attending: Physician Assistant | Admitting: Physician Assistant

## 2018-01-06 ENCOUNTER — Telehealth: Payer: Self-pay

## 2018-01-06 DIAGNOSIS — Z1231 Encounter for screening mammogram for malignant neoplasm of breast: Secondary | ICD-10-CM | POA: Insufficient documentation

## 2018-01-06 DIAGNOSIS — Z1239 Encounter for other screening for malignant neoplasm of breast: Secondary | ICD-10-CM

## 2018-01-06 NOTE — Telephone Encounter (Signed)
-----   Message from Mar Daring, [name] sent at 01/06/2018  1:29 PM EDT ----- Normal mammogram. Repeat screening in one year.

## 2018-01-06 NOTE — Telephone Encounter (Signed)
Viewed by Roxanna Mew on 01/06/2018 1:57 PM

## 2018-03-22 ENCOUNTER — Other Ambulatory Visit: Payer: Self-pay | Admitting: Physician Assistant

## 2018-03-22 DIAGNOSIS — F320 Major depressive disorder, single episode, mild: Secondary | ICD-10-CM

## 2018-05-14 ENCOUNTER — Other Ambulatory Visit: Payer: Self-pay | Admitting: Physician Assistant

## 2018-05-14 DIAGNOSIS — F320 Major depressive disorder, single episode, mild: Secondary | ICD-10-CM

## 2018-05-15 ENCOUNTER — Other Ambulatory Visit: Payer: Self-pay | Admitting: Physician Assistant

## 2018-05-15 DIAGNOSIS — F320 Major depressive disorder, single episode, mild: Secondary | ICD-10-CM

## 2018-05-16 MED ORDER — SERTRALINE HCL 50 MG PO TABS: 100.0000 mg | ORAL_TABLET | Freq: Every day | ORAL | 0 refills | Status: DC

## 2018-08-09 ENCOUNTER — Other Ambulatory Visit: Payer: Self-pay | Admitting: Physician Assistant

## 2018-08-09 DIAGNOSIS — F320 Major depressive disorder, single episode, mild: Secondary | ICD-10-CM

## 2018-11-04 ENCOUNTER — Other Ambulatory Visit: Payer: Self-pay | Admitting: Physician Assistant

## 2018-11-04 DIAGNOSIS — F320 Major depressive disorder, single episode, mild: Secondary | ICD-10-CM

## 2018-11-04 NOTE — Telephone Encounter (Signed)
LOV:01/06/2018

## 2018-11-23 ENCOUNTER — Encounter: Payer: Self-pay | Admitting: Physician Assistant

## 2019-02-27 ENCOUNTER — Encounter: Payer: BLUE CROSS/BLUE SHIELD | Admitting: Physician Assistant

## 2019-03-01 NOTE — Progress Notes (Signed)
Patient: Victoria [name], Female    DOB: [birth year], 43 y.o.   MRN: [id number] Visit Date: 03/02/2019  Today's Provider: Mar Daring, [name]   Chief Complaint  Patient presents with   Annual Exam   Subjective:     Annual physical exam Victoria [name] is a 43 y.o. female who presents today for health maintenance and complete physical. She feels well. She reports exercising some. She reports she is sleeping well. -----------------------------------------------------------   Review of Systems  Constitutional: Negative.   HENT: Negative.   Eyes: Negative.   Respiratory: Negative.   Cardiovascular: Negative.   Gastrointestinal: Negative.   Endocrine: Negative.   Genitourinary: Negative.   Musculoskeletal: Positive for arthralgias.  Skin: Negative.   Allergic/Immunologic: Positive for immunocompromised state.  Neurological: Negative.   Hematological: Negative.   Psychiatric/Behavioral: Negative.     Social History      She  reports that she has been smoking cigarettes. She has a 27.00 pack-year smoking history. She has never used smokeless tobacco. She reports current alcohol use of about 10.0 standard drinks of alcohol per week. She reports that she does not use drugs.       Social History   Socioeconomic History   Marital status: Married    Spouse name: Not on file   Number of children: Not on file   Years of education: Not on file   Highest education level: Not on file  Occupational History   Not on file  Social Needs   Financial resource strain: Not on file   Food insecurity    Worry: Not on file    Inability: Not on file   Transportation needs    Medical: Not on file    Non-medical: Not on file  Tobacco Use   Smoking status: Current Every Day Smoker    Packs/day: 1.00    Years: 27.00    Pack years: 27.00    Types: Cigarettes   Smokeless tobacco: Never Used   Tobacco comment: patient not ready to quit  Substance  and Sexual Activity   Alcohol use: Yes    Alcohol/week: 10.0 standard drinks    Types: 10 Glasses of wine per week   Drug use: No   Sexual activity: Not on file  Lifestyle   Physical activity    Days per week: Not on file    Minutes per session: Not on file   Stress: Not on file  Relationships   Social connections    Talks on phone: Not on file    Gets together: Not on file    Attends religious service: Not on file    Active member of club or organization: Not on file    Attends meetings of clubs or organizations: Not on file    Relationship status: Not on file  Other Topics Concern   Not on file  Social History Narrative   Not on file    Past Medical History:  Diagnosis Date   Allergy    Fatty liver    Lupus ([place])      Patient Active Problem List   Diagnosis Date Noted   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 ([place]) 11/09/2017   Lupus ([place]) 10/07/2017   Fatigue 11/19/2014   Leukocytosis 11/19/2014   Vitamin B12 deficiency 11/19/2014   AA (alcohol abuse) 09/18/2014   History of hay fever 09/18/2014   Fatty infiltration of liver 09/18/2014   Family history of malignant neoplasm of  pancreas 09/18/2014   Family history of diabetes mellitus type II 09/18/2014   Overweight 09/18/2014   Current smoker 09/18/2014    Past Surgical History:  Procedure Laterality Date   ABDOMINAL HYSTERECTOMY  2006   still has ovaries   CARDIAC VALVE REPLACEMENT     SPLENECTOMY, TOTAL  1997    Family History        Family Status  Relation Name Status   Mother  Alive   Father  Deceased at age 22   MGM  (Not Specified)   PGM  (Not Specified)   PGF  (Not Specified)   Brother  Alive        Her family history includes Diabetes in her maternal grandmother and mother; Heart disease in her maternal grandmother, paternal grandfather, and paternal grandmother; Hypertension in her mother; Liver disease in her brother; Pancreatic  cancer in her father.      Allergies  Allergen Reactions   Oxycodone Hcl      Current Outpatient Medications:    diphenhydrAMINE (BENADRYL) 25 mg capsule, Take 1 capsule by mouth every 6 (six) hours as needed., Disp: , Rfl:    sertraline (ZOLOFT) 100 MG tablet, TAKE 1 TABLET (100 MG TOTAL) BY MOUTH AT BEDTIME., Disp: 90 tablet, Rfl: 1   Patient Care Team: Rubye Beach as PCP - General (Family Medicine)    Objective:    Vitals: BP 130/83 (BP Location: Right Arm, Patient Position: Sitting, Cuff Size: Large)    Pulse 76    Temp (!) 97.3 F (36.3 C) (Other (Comment))    Resp 16    Ht 5\' 5"  (1.651 m)    Wt 176 lb (79.8 kg)    SpO2 96%    BMI 29.29 kg/m    Vitals:   03/02/19 1513  BP: 130/83  Pulse: 76  Resp: 16  Temp: (!) 97.3 F (36.3 C)  TempSrc: Other (Comment)  SpO2: 96%  Weight: 176 lb (79.8 kg)  Height: 5\' 5"  (1.651 m)     Physical Exam Vitals signs reviewed.  Constitutional:      General: She is not in acute distress.    Appearance: Normal appearance. She is well-developed. She is not ill-appearing or diaphoretic.  HENT:     Head: Normocephalic and atraumatic.     Right Ear: Tympanic membrane, ear canal and external ear normal.     Left Ear: Tympanic membrane, ear canal and external ear normal.     Nose: Nose normal.     Mouth/Throat:     Mouth: Mucous membranes are moist.     Pharynx: No oropharyngeal exudate.  Eyes:     General: No scleral icterus.       Right eye: No discharge.        Left eye: No discharge.     Extraocular Movements: Extraocular movements intact.     Conjunctiva/sclera: Conjunctivae normal.     Pupils: Pupils are equal, round, and reactive to light.  Neck:     Musculoskeletal: Normal range of motion and neck supple.     Thyroid: No thyromegaly.     Vascular: No JVD.     Trachea: No tracheal deviation.  Cardiovascular:     Rate and Rhythm: Normal rate and regular rhythm.     Pulses: Normal pulses.     Heart sounds:  Normal heart sounds. No murmur. No friction rub. No gallop.   Pulmonary:     Effort: Pulmonary effort is normal. No respiratory distress.  Breath sounds: Normal breath sounds. No wheezing or rales.  Chest:     Chest wall: No tenderness.     Breasts:        Right: Normal.        Left: Normal.  Abdominal:     General: Abdomen is flat. Bowel sounds are normal. There is no distension.     Palpations: Abdomen is soft. There is no mass.     Tenderness: There is no abdominal tenderness. There is no guarding or rebound.  Musculoskeletal: Normal range of motion.        General: No tenderness.     Right lower leg: No edema.     Left lower leg: No edema.  Lymphadenopathy:     Cervical: No cervical adenopathy.  Skin:    General: Skin is warm and dry.     Capillary Refill: Capillary refill takes less than 2 seconds.     Findings: No rash.  Neurological:     General: No focal deficit present.     Mental Status: She is alert and oriented to person, place, and time. Mental status is at baseline.  Psychiatric:        Mood and Affect: Mood normal.        Behavior: Behavior normal.        Thought Content: Thought content normal.        Judgment: Judgment normal.      Depression Screen PHQ 2/9 Scores 03/02/2019 12/16/2017 11/09/2017 10/07/2017  PHQ - 2 Score 0 0 1 1  PHQ- 9 Score 3 4 7 5        Assessment & Plan:     Routine Health Maintenance and Physical Exam  Exercise Activities and Dietary recommendations Goals   None     Immunization History  Administered Date(s) Administered   DTaP 07/03/2014   Tdap 07/03/2014    Health Maintenance  Topic Date Due   HIV Screening  06/10/1988   INFLUENZA VACCINE  12/31/2019 (Originally 12/31/2018)   TETANUS/TDAP  07/03/2024     Discussed health benefits of physical activity, and encouraged her to engage in regular exercise appropriate for her age and condition.    1. Annual physical exam Normal physical exam today. Will check  labs as below and f/u pending lab results. If labs are stable and WNL she will not need to have these rechecked for one year at her next annual physical exam. She is to call the office in the meantime if she has any acute issue, questions or concerns. - CBC w/Diff/Platelet - Comprehensive Metabolic Panel (CMET) - TSH - Lipid Profile - HgB A1c  2. Encounter for breast cancer screening using non-mammogram modality Breast exam today was normal. There is no family history of breast cancer. She does perform regular self breast exams. Mammogram was ordered as below. Information for [facility] Breast clinic was given to patient so she may schedule her mammogram at her convenience. - MM 3D SCREEN BREAST BILATERAL; Future  3. Fatty infiltration of liver Stable. Diet controlled. Will check labs as below and f/u pending results. - CBC w/Diff/Platelet - Comprehensive Metabolic Panel (CMET) - Lipid Profile - HgB A1c  4. 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 Will check labs as below and f/u pending results. - CBC w/Diff/Platelet  5. S/P splenectomy Stable. Will check labs as below and f/u pending results. - CBC w/Diff/Platelet - Comprehensive Metabolic Panel (CMET) - Lipid Profile  6. Lupus ([place]) Stable. Followed by Rheumatology. Will check labs as below and  f/u pending results. - CBC w/Diff/Platelet - Comprehensive Metabolic Panel (CMET) - HgB A1c  7. Family history of diabetes mellitus type II Will check labs as below and f/u pending results. - Comprehensive Metabolic Panel (CMET) - HgB A1c  8. Screening for HIV without presence of risk factors Will check labs as below and f/u pending results. - HIV antibody (with reflex)  --------------------------------------------------------------------    Mar Daring, [name]  [facility]

## 2019-03-02 ENCOUNTER — Other Ambulatory Visit: Payer: Self-pay

## 2019-03-02 ENCOUNTER — Encounter: Payer: Self-pay | Admitting: Physician Assistant

## 2019-03-02 ENCOUNTER — Ambulatory Visit (INDEPENDENT_AMBULATORY_CARE_PROVIDER_SITE_OTHER): Payer: PRIVATE HEALTH INSURANCE | Admitting: Physician Assistant

## 2019-03-02 VITALS — BP 130/83 | HR 76 | Temp 97.3°F | Resp 16 | Ht 65.0 in | Wt 176.0 lb

## 2019-03-02 DIAGNOSIS — D75838 Other thrombocytosis: Secondary | ICD-10-CM

## 2019-03-02 DIAGNOSIS — Z0000 Encounter for general adult medical examination without abnormal findings: Secondary | ICD-10-CM

## 2019-03-02 DIAGNOSIS — Z9081 Acquired absence of spleen: Secondary | ICD-10-CM

## 2019-03-02 DIAGNOSIS — Z1239 Encounter for other screening for malignant neoplasm of breast: Secondary | ICD-10-CM | POA: Diagnosis not present

## 2019-03-02 DIAGNOSIS — K760 Fatty (change of) liver, not elsewhere classified: Secondary | ICD-10-CM | POA: Diagnosis not present

## 2019-03-02 DIAGNOSIS — Z114 Encounter for screening for human immunodeficiency virus [HIV]: Secondary | ICD-10-CM

## 2019-03-02 DIAGNOSIS — D473 Essential (hemorrhagic) thrombocythemia: Secondary | ICD-10-CM | POA: Diagnosis not present

## 2019-03-02 DIAGNOSIS — Z833 Family history of diabetes mellitus: Secondary | ICD-10-CM

## 2019-03-02 DIAGNOSIS — M329 Systemic lupus erythematosus, unspecified: Secondary | ICD-10-CM

## 2019-03-02 NOTE — Patient Instructions (Signed)
Health Maintenance, Female Adopting a healthy lifestyle and getting preventive care are important in promoting health and wellness. Ask your health care provider about:  The right schedule for you to have regular tests and exams.  Things you can do on your own to prevent diseases and keep yourself healthy. What should I know about diet, weight, and exercise? Eat a healthy diet   Eat a diet that includes plenty of vegetables, fruits, low-fat dairy products, and lean protein.  Do not eat a lot of foods that are high in solid fats, added sugars, or sodium. Maintain a healthy weight Body mass index (BMI) is used to identify weight problems. It estimates body fat based on height and weight. Your health care provider can help determine your BMI and help you achieve or maintain a healthy weight. Get regular exercise Get regular exercise. This is one of the most important things you can do for your health. Most adults should:  Exercise for at least 150 minutes each week. The exercise should increase your heart rate and make you sweat (moderate-intensity exercise).  Do strengthening exercises at least twice a week. This is in addition to the moderate-intensity exercise.  Spend less time sitting. Even light physical activity can be beneficial. Watch cholesterol and blood lipids Have your blood tested for lipids and cholesterol at 45 years of age, then have this test every 5 years. Have your cholesterol levels checked more often if:  Your lipid or cholesterol levels are high.  You are older than 45 years of age.  You are at high risk for heart disease. What should I know about cancer screening? Depending on your health history and family history, you may need to have cancer screening at various ages. This may include screening for:  Breast cancer.  Cervical cancer.  Colorectal cancer.  Skin cancer.  Lung cancer. What should I know about heart disease, diabetes, and high blood  pressure? Blood pressure and heart disease  High blood pressure causes heart disease and increases the risk of stroke. This is more likely to develop in people who have high blood pressure readings, are of African descent, or are overweight.  Have your blood pressure checked: ? Every 3-5 years if you are 18-39 years of age. ? Every year if you are 40 years old or older. Diabetes Have regular diabetes screenings. This checks your fasting blood sugar level. Have the screening done:  Once every three years after age 40 if you are at a normal weight and have a low risk for diabetes.  More often and at a younger age if you are overweight or have a high risk for diabetes. What should I know about preventing infection? Hepatitis B If you have a higher risk for hepatitis B, you should be screened for this virus. Talk with your health care provider to find out if you are at risk for hepatitis B infection. Hepatitis C Testing is recommended for:  Everyone born from 1945 through 1965.  Anyone with known risk factors for hepatitis C. Sexually transmitted infections (STIs)  Get screened for STIs, including gonorrhea and chlamydia, if: ? You are sexually active and are younger than 45 years of age. ? You are older than 45 years of age and your health care provider tells you that you are at risk for this type of infection. ? Your sexual activity has changed since you were last screened, and you are at increased risk for chlamydia or gonorrhea. Ask your health care provider if   you are at risk.  Ask your health care provider about whether you are at high risk for HIV. Your health care provider may recommend a prescription medicine to help prevent HIV infection. If you choose to take medicine to prevent HIV, you should first get tested for HIV. You should then be tested every 3 months for as long as you are taking the medicine. Pregnancy  If you are about to stop having your period (premenopausal) and  you may become pregnant, seek counseling before you get pregnant.  Take 400 to 800 micrograms (mcg) of folic acid every day if you become pregnant.  Ask for birth control (contraception) if you want to prevent pregnancy. Osteoporosis and menopause Osteoporosis is a disease in which the bones lose minerals and strength with aging. This can result in bone fractures. If you are 65 years old or older, or if you are at risk for osteoporosis and fractures, ask your health care provider if you should:  Be screened for bone loss.  Take a calcium or vitamin D supplement to lower your risk of fractures.  Be given hormone replacement therapy (HRT) to treat symptoms of menopause. Follow these instructions at home: Lifestyle  Do not use any products that contain nicotine or tobacco, such as cigarettes, e-cigarettes, and chewing tobacco. If you need help quitting, ask your health care provider.  Do not use street drugs.  Do not share needles.  Ask your health care provider for help if you need support or information about quitting drugs. Alcohol use  Do not drink alcohol if: ? Your health care provider tells you not to drink. ? You are pregnant, may be pregnant, or are planning to become pregnant.  If you drink alcohol: ? Limit how much you use to 0-1 drink a day. ? Limit intake if you are breastfeeding.  Be aware of how much alcohol is in your drink. In the [place] one drink equals one 12 oz bottle of beer (355 mL), one 5 oz glass of wine (148 mL), or one 1 oz glass of hard liquor (44 mL). General instructions  Schedule regular health, dental, and eye exams.  Stay current with your vaccines.  Tell your health care provider if: ? You often feel depressed. ? You have ever been abused or do not feel safe at home. Summary  Adopting a healthy lifestyle and getting preventive care are important in promoting health and wellness.  Follow your health care provider's instructions about healthy  diet, exercising, and getting tested or screened for diseases.  Follow your health care provider's instructions on monitoring your cholesterol and blood pressure. This information is not intended to replace advice given to you by your health care provider. Make sure you discuss any questions you have with your health care provider. Document Released: 12/01/2010 Document Revised: 05/11/2018 Document Reviewed: 05/11/2018 Elsevier Patient Education  2020 [employer]  

## 2019-03-04 LAB — COMPREHENSIVE METABOLIC PANEL
ALT: 11 IU/L (ref 0–32)
AST: 16 IU/L (ref 0–40)
Albumin/Globulin Ratio: 1.9 (ref 1.2–2.2)
Albumin: 4.4 g/dL (ref 3.8–4.8)
Alkaline Phosphatase: 56 IU/L (ref 39–117)
BUN/Creatinine Ratio: 26 — ABNORMAL HIGH (ref 9–23)
BUN: 16 mg/dL (ref 6–24)
Bilirubin Total: 0.4 mg/dL (ref 0.0–1.2)
CO2: 21 mmol/L (ref 20–29)
Calcium: 9.5 mg/dL (ref 8.7–10.2)
Chloride: 104 mmol/L (ref 96–106)
Creatinine, Ser: 0.62 mg/dL (ref 0.57–1.00)
GFR calc Af Amer: 126 mL/min/{1.73_m2} (ref >59)
GFR calc non Af Amer: 109 mL/min/{1.73_m2} (ref >59)
Globulin, Total: 2.3 g/dL (ref 1.5–4.5)
Glucose: 100 mg/dL — ABNORMAL HIGH (ref 65–99)
Potassium: 4.8 mmol/L (ref 3.5–5.2)
Sodium: 138 mmol/L (ref 134–144)
Total Protein: 6.7 g/dL (ref 6.0–8.5)

## 2019-03-04 LAB — CBC WITH DIFFERENTIAL/PLATELET
Basophils Absolute: 0.1 10*3/uL (ref 0.0–0.2)
Basos: 1 %
EOS (ABSOLUTE): 0.3 10*3/uL (ref 0.0–0.4)
Eos: 4 %
Hematocrit: 43.1 % (ref 34.0–46.6)
Hemoglobin: 14.9 g/dL (ref 11.1–15.9)
Immature Grans (Abs): 0 10*3/uL (ref 0.0–0.1)
Immature Granulocytes: 0 %
Lymphocytes Absolute: 2.6 10*3/uL (ref 0.7–3.1)
Lymphs: 31 %
MCH: 33.5 pg — ABNORMAL HIGH (ref 26.6–33.0)
MCHC: 34.6 g/dL (ref 31.5–35.7)
MCV: 97 fL (ref 79–97)
Monocytes Absolute: 1.2 10*3/uL — ABNORMAL HIGH (ref 0.1–0.9)
Monocytes: 15 %
Neutrophils Absolute: 4.2 10*3/uL (ref 1.4–7.0)
Neutrophils: 49 %
Platelets: 541 10*3/uL — ABNORMAL HIGH (ref 150–450)
RBC: 4.45 x10E6/uL (ref 3.77–5.28)
RDW: 11.4 % — ABNORMAL LOW (ref 11.7–15.4)
WBC: 8.4 10*3/uL (ref 3.4–10.8)

## 2019-03-04 LAB — HEMOGLOBIN A1C
Est. average glucose Bld gHb Est-mCnc: 105 mg/dL
Hgb A1c MFr Bld: 5.3 % (ref 4.8–5.6)

## 2019-03-04 LAB — LIPID PANEL
Chol/HDL Ratio: 2.6 ratio (ref 0.0–4.4)
Cholesterol, Total: 202 mg/dL — ABNORMAL HIGH (ref 100–199)
HDL: 77 mg/dL (ref >39)
LDL Chol Calc (NIH): 112 mg/dL — ABNORMAL HIGH (ref 0–99)
Triglycerides: 72 mg/dL (ref 0–149)
VLDL Cholesterol Cal: 13 mg/dL (ref 5–40)

## 2019-03-04 LAB — HIV ANTIBODY (ROUTINE TESTING W REFLEX): HIV Screen 4th Generation wRfx: NONREACTIVE

## 2019-03-04 LAB — TSH: TSH: 2.42 u[IU]/mL (ref 0.450–4.500)

## 2019-03-06 ENCOUNTER — Telehealth: Payer: Self-pay

## 2019-03-06 NOTE — Telephone Encounter (Signed)
-----   Message from Mar Daring, [place] sent at 03/06/2019 10:49 AM EDT ----- Blood count is stable. Kidney and liver function are normal. Sodium, potassium, and calcium are normal. Thyroid is normal. Cholesterol is increased from last year and borderline elevated. Work on healthy lifestyle modifications. Sugar/A1c is normal. HIV screen done once in a lifetime is normal.

## 2019-03-06 NOTE — Telephone Encounter (Signed)
Viewed by Roxanna Mew on 03/06/2019 10:55 AM Written by Mar Daring, [name] on 03/06/2019 10:49 AM Blood count is stable. Kidney and liver function are normal. Sodium, potassium, and calcium are normal. Thyroid is normal. Cholesterol is increased from last year and borderline elevated. Work on healthy lifestyle modifications. Sugar/A1c is normal. HIV screen done once in a lifetime is normal.

## 2019-03-07 ENCOUNTER — Encounter: Payer: Self-pay | Admitting: Physician Assistant

## 2019-07-14 ENCOUNTER — Other Ambulatory Visit: Payer: Self-pay | Admitting: Physician Assistant

## 2019-07-14 DIAGNOSIS — F320 Major depressive disorder, single episode, mild: Secondary | ICD-10-CM

## 2021-06-17 DIAGNOSIS — M1712 Unilateral primary osteoarthritis, left knee: Secondary | ICD-10-CM | POA: Diagnosis not present

## 2021-09-29 DIAGNOSIS — M545 Low back pain, unspecified: Secondary | ICD-10-CM | POA: Diagnosis not present

## 2021-10-21 DIAGNOSIS — M545 Low back pain, unspecified: Secondary | ICD-10-CM | POA: Diagnosis not present

## 2021-10-22 NOTE — Progress Notes (Unsigned)
    I,Sha'taria Tyson,acting as a Education administrator for [employer], [name].,have documented all relevant documentation on the behalf of Mikey Kirschner, [name],as directed by  Mikey Kirschner, [name] while in the presence of Mikey Kirschner, [name].   Acute Office Visit  Subjective:     Patient ID: Victoria [name], female    DOB: November 01, 1975, 46 y.o.   MRN: [id number]  No chief complaint on file.   HPI Patient is in today for blood spot under skin and bleeding easily  ROS      Objective:    There were no vitals taken for this visit. {Vitals History (Optional):23777}  Physical Exam  No results found for any visits on 10/23/21.      Assessment & Plan:   Problem List Items Addressed This Visit   None   No orders of the defined types were placed in this encounter.   No follow-ups on file.  Beverlee Nims, [place]

## 2021-10-23 ENCOUNTER — Encounter: Payer: Self-pay | Admitting: Physician Assistant

## 2021-10-23 ENCOUNTER — Ambulatory Visit (INDEPENDENT_AMBULATORY_CARE_PROVIDER_SITE_OTHER): Payer: 59 | Admitting: Physician Assistant

## 2021-10-23 ENCOUNTER — Ambulatory Visit
Admission: RE | Admit: 2021-10-23 | Discharge: 2021-10-23 | Disposition: A | Discharge status: Home / Self Care | Payer: 59 | Source: Ambulatory Visit | Attending: Physician Assistant | Admitting: Physician Assistant

## 2021-10-23 ENCOUNTER — Ambulatory Visit
Admission: RE | Admit: 2021-10-23 | Discharge: 2021-10-23 | Disposition: A | Discharge status: Home / Self Care | Payer: 59 | Attending: Physician Assistant | Admitting: Physician Assistant

## 2021-10-23 VITALS — BP 126/88 | HR 65 | Ht 65.0 in | Wt 164.7 lb

## 2021-10-23 DIAGNOSIS — Z720 Tobacco use: Secondary | ICD-10-CM | POA: Insufficient documentation

## 2021-10-23 DIAGNOSIS — D692 Other nonthrombocytopenic purpura: Secondary | ICD-10-CM | POA: Diagnosis not present

## 2021-10-23 DIAGNOSIS — R61 Generalized hyperhidrosis: Secondary | ICD-10-CM

## 2021-10-23 NOTE — Assessment & Plan Note (Signed)
Ordered pt/ptt , cbc w/ plt  Pt is s/p splenectomy but bleeding symptoms are new Advised pt that even w/ negative bw likely refer to hematology

## 2021-10-23 NOTE — Assessment & Plan Note (Signed)
Ordered prl, fsh, tsh/t4  quantiferon gold r/o tb, low suspicion cxr given symptoms and tobacco use history Advised pt if we can r/o other conditions and bloodwork points to menopausal symptoms, we can discuss management of symptoms

## 2021-10-23 NOTE — Assessment & Plan Note (Signed)
Chronic, pt is precomtemplative

## 2021-10-24 ENCOUNTER — Encounter: Payer: Self-pay | Admitting: Physician Assistant

## 2021-10-28 ENCOUNTER — Other Ambulatory Visit: Payer: Self-pay | Admitting: Physician Assistant

## 2021-10-28 DIAGNOSIS — D692 Other nonthrombocytopenic purpura: Secondary | ICD-10-CM

## 2021-10-28 DIAGNOSIS — D75839 Thrombocytosis, unspecified: Secondary | ICD-10-CM

## 2021-10-28 DIAGNOSIS — R61 Generalized hyperhidrosis: Secondary | ICD-10-CM

## 2021-10-28 DIAGNOSIS — Z9081 Acquired absence of spleen: Secondary | ICD-10-CM

## 2021-10-29 LAB — COMPREHENSIVE METABOLIC PANEL
ALT: 15 IU/L (ref 0–32)
AST: 18 IU/L (ref 0–40)
Albumin/Globulin Ratio: 1.9 (ref 1.2–2.2)
Albumin: 4.8 g/dL (ref 3.8–4.8)
Alkaline Phosphatase: 78 IU/L (ref 44–121)
BUN/Creatinine Ratio: 33 — ABNORMAL HIGH (ref 9–23)
BUN: 19 mg/dL (ref 6–24)
Bilirubin Total: 0.5 mg/dL (ref 0.0–1.2)
CO2: 23 mmol/L (ref 20–29)
Calcium: 10.2 mg/dL (ref 8.7–10.2)
Chloride: 103 mmol/L (ref 96–106)
Creatinine, Ser: 0.58 mg/dL (ref 0.57–1.00)
Globulin, Total: 2.5 g/dL (ref 1.5–4.5)
Glucose: 103 mg/dL — ABNORMAL HIGH (ref 70–99)
Potassium: 5.2 mmol/L (ref 3.5–5.2)
Sodium: 138 mmol/L (ref 134–144)
Total Protein: 7.3 g/dL (ref 6.0–8.5)
eGFR: 112 mL/min/{1.73_m2} (ref >59)

## 2021-10-29 LAB — CBC WITH DIFFERENTIAL/PLATELET
Basophils Absolute: 0.1 10*3/uL (ref 0.0–0.2)
Basos: 1 %
EOS (ABSOLUTE): 0.3 10*3/uL (ref 0.0–0.4)
Eos: 3 %
Hematocrit: 43.4 % (ref 34.0–46.6)
Hemoglobin: 15.1 g/dL (ref 11.1–15.9)
Immature Grans (Abs): 0 10*3/uL (ref 0.0–0.1)
Immature Granulocytes: 0 %
Lymphocytes Absolute: 2.4 10*3/uL (ref 0.7–3.1)
Lymphs: 25 %
MCH: 35 pg — ABNORMAL HIGH (ref 26.6–33.0)
MCHC: 34.8 g/dL (ref 31.5–35.7)
MCV: 101 fL — ABNORMAL HIGH (ref 79–97)
Monocytes Absolute: 1.3 10*3/uL — ABNORMAL HIGH (ref 0.1–0.9)
Monocytes: 14 %
Neutrophils Absolute: 5.4 10*3/uL (ref 1.4–7.0)
Neutrophils: 57 %
Platelets: 581 10*3/uL — ABNORMAL HIGH (ref 150–450)
RBC: 4.32 x10E6/uL (ref 3.77–5.28)
RDW: 11.5 % — ABNORMAL LOW (ref 11.7–15.4)
WBC: 9.5 10*3/uL (ref 3.4–10.8)

## 2021-10-29 LAB — FOLLICLE STIMULATING HORMONE: FSH: 67.1 m[IU]/mL

## 2021-10-29 LAB — TSH+FREE T4
Free T4: 1.58 ng/dL (ref 0.82–1.77)
TSH: 1.82 u[IU]/mL (ref 0.450–4.500)

## 2021-10-29 LAB — QUANTIFERON-TB GOLD PLUS

## 2021-10-29 LAB — PROLACTIN: Prolactin: 16 ng/mL (ref 4.8–23.3)

## 2021-10-29 LAB — PT AND PTT
INR: 0.9 (ref 0.9–1.2)
Prothrombin Time: 9.8 s (ref 9.1–12.0)
aPTT: 28 s (ref 24–33)

## 2021-10-31 ENCOUNTER — Other Ambulatory Visit: Payer: Self-pay | Admitting: Physician Assistant

## 2021-10-31 DIAGNOSIS — R61 Generalized hyperhidrosis: Secondary | ICD-10-CM

## 2021-10-31 DIAGNOSIS — D692 Other nonthrombocytopenic purpura: Secondary | ICD-10-CM

## 2021-10-31 DIAGNOSIS — M545 Low back pain, unspecified: Secondary | ICD-10-CM | POA: Diagnosis not present

## 2021-11-05 ENCOUNTER — Encounter: Payer: Self-pay | Admitting: Oncology

## 2021-11-05 ENCOUNTER — Inpatient Hospital Stay: Payer: 59

## 2021-11-05 ENCOUNTER — Inpatient Hospital Stay: Payer: 59 | Attending: Oncology | Admitting: Oncology

## 2021-11-05 VITALS — BP 146/93 | HR 103 | Temp 96.8°F | Resp 18 | Wt 167.9 lb

## 2021-11-05 DIAGNOSIS — R233 Spontaneous ecchymoses: Secondary | ICD-10-CM | POA: Insufficient documentation

## 2021-11-05 DIAGNOSIS — Z720 Tobacco use: Secondary | ICD-10-CM | POA: Diagnosis not present

## 2021-11-05 DIAGNOSIS — R69 Illness, unspecified: Secondary | ICD-10-CM | POA: Diagnosis not present

## 2021-11-05 DIAGNOSIS — Z9081 Acquired absence of spleen: Secondary | ICD-10-CM | POA: Insufficient documentation

## 2021-11-05 DIAGNOSIS — D72821 Monocytosis (symptomatic): Secondary | ICD-10-CM | POA: Insufficient documentation

## 2021-11-05 DIAGNOSIS — D75839 Thrombocytosis, unspecified: Secondary | ICD-10-CM | POA: Insufficient documentation

## 2021-11-05 DIAGNOSIS — D75838 Other thrombocytosis: Secondary | ICD-10-CM

## 2021-11-05 DIAGNOSIS — Z800 Family history of malignant neoplasm of digestive organs: Secondary | ICD-10-CM | POA: Diagnosis not present

## 2021-11-05 DIAGNOSIS — Z90710 Acquired absence of both cervix and uterus: Secondary | ICD-10-CM | POA: Insufficient documentation

## 2021-11-05 DIAGNOSIS — F17210 Nicotine dependence, cigarettes, uncomplicated: Secondary | ICD-10-CM | POA: Diagnosis not present

## 2021-11-05 NOTE — Progress Notes (Signed)
Hematology/Oncology Consult note [phone] [phone]         Patient Care Team: Mikey Kirschner, Hershal Coria as PCP - General (Physician Assistant)  REFERRING PROVIDER: Mikey Kirschner, [name]  CHIEF COMPLAINTS/REASON FOR VISIT:  Evaluation of thrombocytosis  HISTORY OF PRESENTING ILLNESS:   Victoria [name] is a  46 y.o.  female with PMH listed below was seen in consultation at the request of  Mikey Kirschner, [name]  for evaluation of thrombocytosis.  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  She has noticed marks on her forearm bilaterally, easy bruising after minor injuries. No bruising on her truck or lower extremities.  Denies hematochezia, hematuria, hematemesis, epistaxis, black tarry stool.  + hot flash and sweating at night. Recently diagnosed post menopausal.   Denies weight loss, fever, chills, fatigue. She takes ibuprofen 1-2 times per day for left knee pain.    Review of Systems  Constitutional:  Negative for appetite change, chills, fatigue and fever.  HENT:   Negative for hearing loss and voice change.   Eyes:  Negative for eye problems.  Respiratory:  Negative for chest tightness and cough.   Cardiovascular:  Negative for chest pain.  Gastrointestinal:  Negative for abdominal distention, abdominal pain and blood in stool.  Endocrine: Negative for hot flashes.  Genitourinary:  Negative for difficulty urinating and frequency.   Musculoskeletal:  Negative for arthralgias.  Skin:  Negative for itching and rash.  Neurological:  Negative for extremity weakness.  Hematological:  Negative for adenopathy. Bruises/bleeds easily.  Psychiatric/Behavioral:  Negative for confusion.    MEDICAL HISTORY:  Past Medical History:  Diagnosis Date   Allergy    Fatty liver    Lupus ([place])     SURGICAL HISTORY: Past Surgical History:  Procedure Laterality Date   ABDOMINAL HYSTERECTOMY  2006   still  has ovaries   SPLENECTOMY, TOTAL  1997    SOCIAL HISTORY: Social History   Socioeconomic History   Marital status: Married    Spouse name: Not on file   Number of children: Not on file   Years of education: Not on file   Highest education level: Not on file  Occupational History   Not on file  Tobacco Use   Smoking status: Every Day    Packs/day: 1.00    Years: 31.00    Pack years: 31.00    Types: Cigarettes   Smokeless tobacco: Never   Tobacco comments:    patient not ready to quit  Vaping Use   Vaping Use: Never used  Substance and Sexual Activity   Alcohol use: Yes    Alcohol/week: 10.0 standard drinks    Types: 10 Glasses of wine per week   Drug use: No   Sexual activity: Not on file  Other Topics Concern   Not on file  Social History Narrative   Not on file   Social Determinants of Health   Financial Resource Strain: Not on file  Food Insecurity: Not on file  Transportation Needs: Not on file  Physical Activity: Not on file  Stress: Not on file  Social Connections: Not on file  Intimate Partner Violence: Not on file    FAMILY HISTORY: Family History  Problem Relation Age of Onset   Diabetes Mother    Hypertension Mother    Pancreatic cancer Father    Diabetes Maternal Grandmother    Heart disease Maternal Grandmother    Heart disease Paternal Grandmother  Heart disease Paternal Grandfather    Liver disease Brother     ALLERGIES:  is allergic to oxycodone hcl.  MEDICATIONS:  Current Outpatient Medications  Medication Sig Dispense Refill   ibuprofen (ADVIL) 200 MG tablet Take by mouth.     cyclobenzaprine (FLEXERIL) 5 MG tablet cyclobenzaprine 5 mg tablet  TAKE 1 TABLET BY MOUTH EVERYDAY AT BEDTIME (Patient not taking: Reported on 11/05/2021)     meloxicam (MOBIC) 15 MG tablet meloxicam 15 mg tablet (Patient not taking: Reported on 11/05/2021)     No current facility-administered medications for this visit.     PHYSICAL  EXAMINATION:  Vitals:   11/05/21 1451  BP: (!) 146/93  Pulse: (!) 103  Resp: 18  Temp: (!) 96.8 F (36 C)   Filed Weights   11/05/21 1451  Weight: 167 lb 14.4 oz (76.2 kg)    Physical Exam Constitutional:      General: She is not in acute distress. HENT:     Head: Normocephalic and atraumatic.  Eyes:     General: No scleral icterus. Cardiovascular:     Rate and Rhythm: Normal rate and regular rhythm.     Heart sounds: Normal heart sounds.  Pulmonary:     Effort: Pulmonary effort is normal. No respiratory distress.     Breath sounds: No wheezing.  Abdominal:     General: Bowel sounds are normal. There is no distension.     Palpations: Abdomen is soft.  Musculoskeletal:        General: No deformity. Normal range of motion.     Cervical back: Normal range of motion and neck supple.  Skin:    General: Skin is warm and dry.     Comments: Bilateral forearm bruising  Neurological:     Mental Status: She is alert and oriented to person, place, and time. Mental status is at baseline.     Cranial Nerves: No cranial nerve deficit.     Coordination: Coordination normal.  Psychiatric:        Mood and Affect: Mood normal.    LABORATORY DATA:  I have reviewed the data as listed Lab Results  Component Value Date   WBC 9.5 10/23/2021   HGB 15.1 10/23/2021   HCT 43.4 10/23/2021   MCV 101 (H) 10/23/2021   PLT 581 (H) 10/23/2021   Recent Labs    10/23/21 0939  NA 138  K 5.2  CL 103  CO2 23  GLUCOSE 103*  BUN 19  CREATININE 0.58  CALCIUM 10.2  PROT 7.3  ALBUMIN 4.8  AST 18  ALT 15  ALKPHOS 78  BILITOT 0.5   Iron/TIBC/Ferritin/ %Sat No results found for: IRON, TIBC, FERRITIN, IRONPCTSAT    RADIOGRAPHIC STUDIES: I have personally reviewed the radiological images as listed and agreed with the findings in the report. DG Chest 2 View  Result Date: 10/24/2021 CLINICAL DATA:  Night sweats.  Smoking history. EXAM: CHEST - 2 VIEW COMPARISON:  Chest radiograph  01/20/2007 FINDINGS: The heart size and mediastinal contours are within normal limits. Both lungs are clear. The visualized skeletal structures are unremarkable. IMPRESSION: No active cardiopulmonary disease. Electronically Signed   By: Lovey Newcomer M.D.   On: 10/24/2021 13:32      ASSESSMENT & PLAN:  1. 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 Monocytosis   3. Easy bruising   4. Tobacco use    # Thrombocytosis, likely due to splenectomy. Rule out primary bone marrow disorders. Check Jak 2 mutation with reflex #  Monocytosis, likely reactive, smoking, chronic inflammation. Check flowcytometry, cbc,smear, ANA, hepatitis.  # Easy bruising of extremities.  Could be due to chronic ibuprofen use, senile purpura.  She has normal PT and PTT. Check ANA, platelet function assay. Hold ibuprofen.   # Tobacco use, smoke cessation recommended.   Orders Placed This Encounter  Procedures   Comprehensive metabolic panel    Standing Status:   Future    Standing Expiration Date:   11/06/2022   Technologist smear review    Standing Status:   Future    Standing Expiration Date:   11/06/2022   Platelet function assay    Standing Status:   Future    Standing Expiration Date:   11/06/2022   ANA w/Reflex    Standing Status:   Future    Standing Expiration Date:   11/06/2022   Hepatitis panel, acute    Standing Status:   Future    Standing Expiration Date:   11/06/2022   Miscellaneous [facility] test (send-out)    Standing Status:   Future    Standing Expiration Date:   11/06/2022    Order Specific Question:   Test name / description:    Answer:   JAk2 V617F with reflex to EX 12-15/CARL/MPL  [id number]    All questions were answered. The patient knows to call the clinic with any problems questions or concerns.  cc Mikey Kirschner, [name]    Thank you for this kind referral and the opportunity to participate in the care of this patient. A copy of today's note is routed to referring provider   Earlie Server, MD, PhD [facility] Hematology Oncology 11/05/2021

## 2021-11-13 ENCOUNTER — Telehealth (INDEPENDENT_AMBULATORY_CARE_PROVIDER_SITE_OTHER): Payer: 59 | Admitting: Physician Assistant

## 2021-11-13 ENCOUNTER — Encounter: Payer: Self-pay | Admitting: Physician Assistant

## 2021-11-13 DIAGNOSIS — N951 Menopausal and female climacteric states: Secondary | ICD-10-CM | POA: Diagnosis not present

## 2021-11-13 MED ORDER — VENLAFAXINE HCL ER 37.5 MG PO CP24: 37.5000 mg | ORAL_CAPSULE | Freq: Every day | ORAL | 1 refills | Status: DC

## 2021-11-13 NOTE — Assessment & Plan Note (Signed)
Discussed HRT vs nonhormnal therapy Pt not a candidate for HRT d/t tobacco use. She is s/p hysterectomy. Discussed paxil vs effexor, moa, side effects, dosage.  Gave information on nonmedication options. Will try effexor 37.5 mg, after one week can increase to 75 mg.

## 2021-11-13 NOTE — Patient Instructions (Signed)
Black cohosh.   Vitamin E.   Evening Primrose Oil

## 2021-11-13 NOTE — Progress Notes (Signed)
MyChart Video Visit    Virtual Visit via Video Note   This visit type was conducted due to national recommendations for restrictions regarding the COVID-19 Pandemic (e.g. social distancing) in an effort to limit this patient's exposure and mitigate transmission in our community. This patient is at least at moderate risk for complications without adequate follow up. This format is felt to be most appropriate for this patient at this time. Physical exam was limited by quality of the video and audio technology used for the visit.   Patient location: home Provider location: [facility]  I discussed the limitations of evaluation and management by telemedicine and the availability of in person appointments. The patient expressed understanding and agreed to proceed.  Patient: Victoria [name]   DOB: Oct 02, 1975   46 y.o. Female  MRN: [id number] Visit Date: 11/13/2021  Today's healthcare provider: Mikey Kirschner, [name]   Cc. Menopausal symptoms, hot flashes  Subjective    HPI   Victoria [name] is a 46 y/o female who presents for help with hot flashes/night sweats. Labs were run recently that determined she was in menopause.    Medications: Outpatient Medications Prior to Visit  Medication Sig   naproxen sodium (ALEVE) 220 MG tablet Take 220 mg by mouth.   [DISCONTINUED] cyclobenzaprine (FLEXERIL) 5 MG tablet cyclobenzaprine 5 mg tablet  TAKE 1 TABLET BY MOUTH EVERYDAY AT BEDTIME (Patient not taking: Reported on 11/05/2021)   [DISCONTINUED] ibuprofen (ADVIL) 200 MG tablet Take by mouth. (Patient not taking: Reported on 11/13/2021)   [DISCONTINUED] meloxicam (MOBIC) 15 MG tablet meloxicam 15 mg tablet (Patient not taking: Reported on 11/05/2021)   No facility-administered medications prior to visit.    Review of Systems  Constitutional:  Positive for diaphoresis. Negative for fatigue and fever.  Respiratory:  Negative for cough and shortness of breath.   Cardiovascular:   Negative for chest pain and leg swelling.  Gastrointestinal:  Negative for abdominal pain.  Neurological:  Negative for dizziness and headaches.      Objective    There were no vitals taken for this visit.     Assessment & Plan     Problem List Items Addressed This Visit       Other   Vasomotor symptoms due to menopause - Primary    Discussed HRT vs nonhormnal therapy Pt not a candidate for HRT d/t tobacco use. She is s/p hysterectomy. Discussed paxil vs effexor, moa, side effects, dosage.  Gave information on nonmedication options. Will try effexor 37.5 mg, after one week can increase to 75 mg.       Relevant Medications   venlafaxine XR (EFFEXOR XR) 37.5 MG 24 hr capsule     Return in about 6 weeks (around 12/25/2021) for menopausal symptoms.     I discussed the assessment and treatment plan with the patient. The patient was provided an opportunity to ask questions and all were answered. The patient agreed with the plan and demonstrated an understanding of the instructions.   The patient was advised to call back or seek an in-person evaluation if the symptoms worsen or if the condition fails to improve as anticipated.  I provided 20 minutes of non-face-to-face time during this encounter.  I, Mikey Kirschner, [name] have reviewed all documentation for this visit. The documentation on 11/13/2021 for the exam, diagnosis, procedures, and orders are all accurate and complete.  Mikey Kirschner, [name] [facility] [address] [place], [place], [address] Office: [phone] Fax: [phone]   [facility]

## 2021-11-19 ENCOUNTER — Inpatient Hospital Stay: Payer: 59

## 2021-11-19 DIAGNOSIS — M545 Low back pain, unspecified: Secondary | ICD-10-CM | POA: Diagnosis not present

## 2021-12-06 ENCOUNTER — Other Ambulatory Visit: Payer: Self-pay | Admitting: Physician Assistant

## 2021-12-06 DIAGNOSIS — N951 Menopausal and female climacteric states: Secondary | ICD-10-CM

## 2022-02-16 DIAGNOSIS — M1712 Unilateral primary osteoarthritis, left knee: Secondary | ICD-10-CM | POA: Diagnosis not present

## 2022-03-20 ENCOUNTER — Ambulatory Visit
Admission: EM | Admit: 2022-03-20 | Discharge: 2022-03-20 | Disposition: A | Discharge status: Home / Self Care | Payer: 59 | Attending: Emergency Medicine | Admitting: Emergency Medicine

## 2022-03-20 DIAGNOSIS — R051 Acute cough: Secondary | ICD-10-CM | POA: Diagnosis not present

## 2022-03-20 DIAGNOSIS — J0100 Acute maxillary sinusitis, unspecified: Secondary | ICD-10-CM

## 2022-03-20 DIAGNOSIS — J209 Acute bronchitis, unspecified: Secondary | ICD-10-CM | POA: Diagnosis not present

## 2022-03-20 LAB — POCT RAPID STREP A (OFFICE): Rapid Strep A Screen: NEGATIVE

## 2022-03-20 MED ORDER — ALBUTEROL SULFATE HFA 108 (90 BASE) MCG/ACT IN AERS: 1.0000 | INHALATION_SPRAY | Freq: Four times a day (QID) | RESPIRATORY_TRACT | 0 refills | Status: DC | PRN

## 2022-03-20 MED ORDER — AMOXICILLIN 875 MG PO TABS: 875.0000 mg | ORAL_TABLET | Freq: Two times a day (BID) | ORAL | 0 refills | Status: AC

## 2022-03-20 MED ORDER — BENZONATATE 100 MG PO CAPS: 100.0000 mg | ORAL_CAPSULE | Freq: Three times a day (TID) | ORAL | 0 refills | Status: DC | PRN

## 2022-03-20 NOTE — ED Triage Notes (Addendum)
Patient to Urgent Care with complaints of cough and sore throat x10 days. Reports multiple sick people at work. Cough is productive at times, reports that she has tried multiple otc cough suppressants including delsym/ alka seltzer with minimal improvement. Has been unable to sleep.   Denies any recent fevers.

## 2022-03-20 NOTE — ED Provider Notes (Signed)
[facility]    CSN: [id number] Arrival date & time: 03/20/22  6270      History   Chief Complaint Chief Complaint  Patient presents with   Cough   Sore Throat    HPI Victoria [name] is a 46 y.o. female.  Patient presents with 10-day history of congestion, postnasal drip, runny nose, sore throat, cough.  Cough is keeping her awake at night.  Multiple OTC treatments attempted.  No fever, rash, shortness of breath, vomiting, diarrhea, or other symptoms.  Her medical history includes lupus, fatty liver, seasonal allergies.  Current everyday smoker x 30+ years.  The history is provided by the patient and medical records.    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      Home Medications    Prior to Admission medications   Medication Sig Start Date End Date Taking? Authorizing Provider  albuterol (VENTOLIN HFA) 108 (90 Base) MCG/ACT inhaler Inhale 1-2 puffs into the lungs every 6 (six) hours as needed. 03/20/22  Yes Sharion Balloon, NP  amoxicillin (AMOXIL) 875 MG tablet Take 1 tablet (875 mg  total) by mouth 2 (two) times daily for 7 days. 03/20/22 03/27/22 Yes Sharion Balloon, NP  benzonatate (TESSALON) 100 MG capsule Take 1 capsule (100 mg total) by mouth 3 (three) times daily as needed for cough. 03/20/22  Yes Sharion Balloon, NP  naproxen sodium (ALEVE) 220 MG tablet Take 220 mg by mouth.    [provider]  venlafaxine XR (EFFEXOR-XR) 37.5 MG 24 hr capsule TAKE 1 CAPSULE (37.5 MG TOTAL) BY MOUTH DAILY WITH BREAKFAST. AFTER ONE WEEK CAN INCREASE TO 2 CAPSULES (75 MG) DAILY 12/08/21   Mikey Kirschner, [name]    Family History Family History  Problem Relation Age of Onset   Diabetes Mother    Hypertension Mother    Pancreatic cancer Father    Diabetes Maternal Grandmother    Heart disease Maternal Grandmother    Heart disease Paternal Grandmother    Heart disease Paternal Grandfather    Liver disease Brother     Social History Social History   Tobacco Use   Smoking status: Every Day    Packs/day: 1.00    Years: 31.00    Total pack years: 31.00    Types: Cigarettes   Smokeless tobacco: Never   Tobacco comments:    patient not ready to quit  Vaping Use   Vaping Use: Never used  Substance Use Topics   Alcohol use: Yes    Alcohol/week:  10.0 standard drinks of alcohol    Types: 10 Glasses of wine per week   Drug use: No     Allergies   Oxycodone hcl   Review of Systems Review of Systems  Constitutional:  Negative for chills and fever.  HENT:  Positive for congestion, postnasal drip, rhinorrhea and sore throat. Negative for ear pain.   Respiratory:  Positive for cough. Negative for shortness of breath.   Cardiovascular:  Negative for chest pain and palpitations.  Gastrointestinal:  Negative for diarrhea and vomiting.  Skin:  Negative for color change and rash.  All other systems reviewed and are negative.    Physical Exam Triage Vital Signs ED Triage Vitals  Enc Vitals Group     BP      Pulse      Resp      Temp      Temp src      SpO2       Weight      Height      Head Circumference      Peak Flow      Pain Score      Pain Loc      Pain Edu?      Excl. in [place]?    No data found.  Updated Vital Signs BP 136/88   Pulse 76   Temp 98.9 F (37.2 C)   Resp 17   Ht '5\' 5"'$  (1.651 m)   Wt 165 lb (74.8 kg)   SpO2 98%   BMI 27.46 kg/m   Visual Acuity Right Eye Distance:   Left Eye Distance:   Bilateral Distance:    Right Eye Near:   Left Eye Near:    Bilateral Near:     Physical Exam Vitals and nursing note reviewed.  Constitutional:      General: She is not in acute distress.    Appearance: Normal appearance. She is well-developed. She is not ill-appearing.  HENT:     Right Ear: Tympanic membrane normal.     Left Ear: Tympanic membrane normal.     Nose: Congestion present.     Mouth/Throat:     Mouth: Mucous membranes are moist.     Pharynx: Posterior oropharyngeal erythema present.  Cardiovascular:     Rate and Rhythm: Normal rate and regular rhythm.     Heart sounds: Normal heart sounds.  Pulmonary:     Effort: Pulmonary effort is normal. No respiratory distress.     Breath sounds: Normal breath sounds.  Musculoskeletal:     Cervical back: Neck supple.  Skin:    General: Skin is warm and dry.  Neurological:     Mental Status: She is alert.  Psychiatric:        Mood and Affect: Mood normal.        Behavior: Behavior normal.      UC Treatments / Results  Labs (all labs ordered are listed, but only abnormal results are displayed) Labs Reviewed  POCT RAPID STREP A (OFFICE)    EKG   Radiology No results found.  Procedures Procedures (including critical care time)  Medications Ordered in UC Medications - No data to display  Initial Impression / Assessment and Plan / UC Course  I have reviewed the triage vital signs and the nursing notes.  Pertinent labs & imaging results that were available during my care of the patient were reviewed by me and considered in my medical decision making  (see chart for details).  Acute sinusitis, cough, acute bronchitis.  Current everyday smoker x 30+ years.  Lungs are clear and O2 sat is 98% on room air.  Patient has been symptomatic for 10 days.  Treating with albuterol inhaler, amoxicillin, Tessalon Perles.  Education provided on sinusitis, cough, bronchitis.  Instructed patient to follow up with her PCP if her symptoms are not improving.  She agrees to plan of care.    Final Clinical Impressions(s) / UC Diagnoses   Final diagnoses:  Acute non-recurrent maxillary sinusitis  Acute cough  Acute bronchitis, unspecified organism     Discharge Instructions      Use the albuterol inhaler as directed.  Take the amoxicillin and Tessalon Perles as directed.  Follow up with your primary care provider if your symptoms are not improving.        ED Prescriptions     Medication Sig Dispense Auth. Provider   benzonatate (TESSALON) 100 MG capsule Take 1 capsule (100 mg total) by mouth 3 (three) times daily as needed for cough. 21 capsule Sharion Balloon, NP   amoxicillin (AMOXIL) 875 MG tablet Take 1 tablet (875 mg total) by mouth 2 (two) times daily for 7 days. 14 tablet Sharion Balloon, NP   albuterol (VENTOLIN HFA) 108 (90 Base) MCG/ACT inhaler Inhale 1-2 puffs into the lungs every 6 (six) hours as needed. 18 g Sharion Balloon, NP      PDMP not reviewed this encounter.   Sharion Balloon, NP 03/20/22 [phone]

## 2022-03-20 NOTE — Discharge Instructions (Addendum)
Use the albuterol inhaler as directed.  Take the amoxicillin and Tessalon Perles as directed.  Follow up with your primary care provider if your symptoms are not improving.

## 2022-05-18 DIAGNOSIS — S61211A Laceration without foreign body of left index finger without damage to nail, initial encounter: Secondary | ICD-10-CM | POA: Diagnosis not present

## 2022-05-18 DIAGNOSIS — S61012A Laceration without foreign body of left thumb without damage to nail, initial encounter: Secondary | ICD-10-CM | POA: Diagnosis not present

## 2022-06-23 ENCOUNTER — Other Ambulatory Visit: Payer: Self-pay | Admitting: Physician Assistant

## 2022-06-23 DIAGNOSIS — N951 Menopausal and female climacteric states: Secondary | ICD-10-CM

## 2022-06-24 NOTE — Telephone Encounter (Signed)
LVM--to call the office back regarding medication verification.

## 2022-06-24 NOTE — Telephone Encounter (Signed)
Pt verified taking 2 tab. Denied nausea/dizziness.

## 2022-07-18 ENCOUNTER — Other Ambulatory Visit: Payer: Self-pay | Admitting: Physician Assistant

## 2022-07-18 DIAGNOSIS — N951 Menopausal and female climacteric states: Secondary | ICD-10-CM

## 2022-10-06 DIAGNOSIS — M170 Bilateral primary osteoarthritis of knee: Secondary | ICD-10-CM | POA: Diagnosis not present

## 2022-10-06 DIAGNOSIS — M1712 Unilateral primary osteoarthritis, left knee: Secondary | ICD-10-CM | POA: Diagnosis not present

## 2022-10-12 DIAGNOSIS — M545 Low back pain, unspecified: Secondary | ICD-10-CM | POA: Diagnosis not present

## 2022-10-12 DIAGNOSIS — M170 Bilateral primary osteoarthritis of knee: Secondary | ICD-10-CM | POA: Diagnosis not present

## 2022-10-12 DIAGNOSIS — M2550 Pain in unspecified joint: Secondary | ICD-10-CM | POA: Diagnosis not present

## 2022-10-26 ENCOUNTER — Other Ambulatory Visit: Payer: Self-pay | Admitting: Physician Assistant

## 2022-10-26 DIAGNOSIS — N951 Menopausal and female climacteric states: Secondary | ICD-10-CM

## 2022-11-22 ENCOUNTER — Other Ambulatory Visit: Payer: Self-pay | Admitting: Physician Assistant

## 2022-11-22 DIAGNOSIS — N951 Menopausal and female climacteric states: Secondary | ICD-10-CM

## 2022-11-23 DIAGNOSIS — M170 Bilateral primary osteoarthritis of knee: Secondary | ICD-10-CM | POA: Diagnosis not present

## 2022-11-30 DIAGNOSIS — M170 Bilateral primary osteoarthritis of knee: Secondary | ICD-10-CM | POA: Diagnosis not present

## 2022-12-07 DIAGNOSIS — M170 Bilateral primary osteoarthritis of knee: Secondary | ICD-10-CM | POA: Diagnosis not present

## 2022-12-15 DIAGNOSIS — L931 Subacute cutaneous lupus erythematosus: Secondary | ICD-10-CM | POA: Diagnosis not present

## 2023-04-05 DIAGNOSIS — M23203 Derangement of unspecified medial meniscus due to old tear or injury, right knee: Secondary | ICD-10-CM | POA: Diagnosis not present

## 2023-04-05 DIAGNOSIS — M170 Bilateral primary osteoarthritis of knee: Secondary | ICD-10-CM | POA: Diagnosis not present

## 2023-04-07 ENCOUNTER — Other Ambulatory Visit: Payer: Self-pay | Admitting: Orthopedic Surgery

## 2023-04-07 ENCOUNTER — Encounter: Payer: Self-pay | Admitting: Orthopedic Surgery

## 2023-04-07 DIAGNOSIS — M170 Bilateral primary osteoarthritis of knee: Secondary | ICD-10-CM

## 2023-04-07 DIAGNOSIS — M23203 Derangement of unspecified medial meniscus due to old tear or injury, right knee: Secondary | ICD-10-CM

## 2023-04-23 ENCOUNTER — Ambulatory Visit
Admission: RE | Admit: 2023-04-23 | Discharge: 2023-04-23 | Disposition: A | Discharge status: Home / Self Care | Payer: 59 | Source: Ambulatory Visit | Attending: Orthopedic Surgery | Admitting: Orthopedic Surgery

## 2023-04-23 DIAGNOSIS — S83231A Complex tear of medial meniscus, current injury, right knee, initial encounter: Secondary | ICD-10-CM | POA: Diagnosis not present

## 2023-04-23 DIAGNOSIS — M23203 Derangement of unspecified medial meniscus due to old tear or injury, right knee: Secondary | ICD-10-CM

## 2023-04-23 DIAGNOSIS — M170 Bilateral primary osteoarthritis of knee: Secondary | ICD-10-CM

## 2023-05-17 DIAGNOSIS — M170 Bilateral primary osteoarthritis of knee: Secondary | ICD-10-CM | POA: Diagnosis not present

## 2023-06-02 ENCOUNTER — Other Ambulatory Visit: Payer: Self-pay | Admitting: Physician Assistant

## 2023-06-02 DIAGNOSIS — N951 Menopausal and female climacteric states: Secondary | ICD-10-CM

## 2023-06-04 ENCOUNTER — Telehealth: Payer: Self-pay | Admitting: Physician Assistant

## 2023-06-04 DIAGNOSIS — N951 Menopausal and female climacteric states: Secondary | ICD-10-CM

## 2023-06-04 NOTE — Telephone Encounter (Addendum)
 Medication Refill -  Most Recent Primary Care Visit:  Provider: CYNDI SHAVER  Department: [facility]  Visit Type: MYCHART VIDEO VISIT  Date: 11/13/2021  Medication:  venlafaxine  XR (EFFEXOR -XR) 37.5 MG 24 hr capsule  Has the patient contacted their pharmacy? Yes This was last done by Morna Cyndi Is this the correct pharmacy for this prescription? Yes If no, delete pharmacy and type the correct one.  This is the patient's preferred pharmacy:  [id number] - [place], [place] - [address] [place] [address] Phone: [phone] Fax: [phone]   Has the prescription been filled recently? No  Is the patient out of the medication? Yes   Has the patient been seen for an appointment in the last year OR does the patient have an upcoming appointment? Yes   Can we respond through MyChart? Yes  Agent: Please be advised that Rx refills may take up to 3 business days. We ask that you follow-up with your pharmacy.

## 2023-06-08 NOTE — Telephone Encounter (Signed)
 Requested medications are due for refill today.  yes  Requested medications are on the active medications list.  yes  Last refill. 11/23/2022 #180 1 rf  Future visit scheduled.   yes  Notes to clinic.  Victoria Victoria listed as PCP.Labs are expired. Pt is more than 3 months overdue for an ov.    Requested Prescriptions  Pending Prescriptions Disp Refills   venlafaxine  XR (EFFEXOR -XR) 37.5 MG 24 hr capsule 180 capsule 1     Psychiatry: Antidepressants - SNRI - desvenlafaxine & venlafaxine  Failed - 06/08/2023  9:44 AM      Failed - Cr in normal range and within 360 days    Creatinine, Ser  Date Value Ref Range Status  10/23/2021 0.58 0.57 - 1.00 mg/dL Final         Failed - Valid encounter within last 6 months    Recent Outpatient Visits           1 year ago Vasomotor symptoms due to menopause   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   [facility] Victoria, Del[place] [name], [place]       Future Appointments             In 1 week Victoria, [name], [name] [facility]            Failed - Lipid Panel in normal range within the last 12 months    Cholesterol, Total  Date Value Ref Range Status  03/03/2019 202 (H) 100 - 199 mg/dL Final   LDL Chol Calc (NIH)  Date Value Ref Range Status  03/03/2019 112 (H) 0 - 99 mg/dL Final   HDL  Date Value Ref Range Status  03/03/2019 77 >39 mg/dL Final   Triglycerides  Date Value Ref Range Status  03/03/2019 72 0 - 149 mg/dL Final         Passed - Last BP in normal range    BP Readings from Last 1 Encounters:  03/20/22 136/88

## 2023-06-09 ENCOUNTER — Ambulatory Visit: Payer: Self-pay | Admitting: *Deleted

## 2023-06-09 ENCOUNTER — Telehealth: Payer: Self-pay | Admitting: Physician Assistant

## 2023-06-09 DIAGNOSIS — N951 Menopausal and female climacteric states: Secondary | ICD-10-CM

## 2023-06-09 MED ORDER — VENLAFAXINE HCL ER 37.5 MG PO CP24: ORAL_CAPSULE | ORAL | 0 refills | Status: DC

## 2023-06-09 NOTE — Telephone Encounter (Signed)
 Attempted to call patient- no answer- left message to call office. (Medication refill refused by office: needs appointment,- establish with another provider, labs over due)

## 2023-06-09 NOTE — Telephone Encounter (Signed)
  Chief Complaint: requesting enough medication until appt on 06/16/23 for effexor  37.5 mg . Last prescribed by L. Drubel, PA.  Symptoms: none at this time  Frequency: 2 days  Pertinent Negatives: Patient denies sweating no chest pain no difficulty breathing  Disposition: [] ED /[] Urgent Care (no appt availability in office) / [x] Appointment(In office/virtual)/ []  [place] [facility]/ [] Home Care/ [] Refused Recommended Disposition /[] [place] [facility]/ []  Follow-up with PCP Additional Notes:    Patient appt 06/16/23 and now out of medication. Requesting enough medication prescribed  until next OV.  Patient requesting a call back if new provider will not give enough medication to get through until OV. Patient afraid of stopping medication abruptly. Please advise.         Reason for Disposition  [1] Caller has URGENT medicine question about med that PCP or specialist prescribed AND [2] triager unable to answer question  Answer Assessment - Initial Assessment Questions 1. NAME of MEDICINE: What medicine(s) are you calling about?     Effexor   2. QUESTION: What is your question? (e.g., double dose of medicine, side effect)     Can medication be refilled for enough until appt on 06/16/23? 3. PRESCRIBER: Who prescribed the medicine? Reason: if prescribed by specialist, call should be referred to that group.     L. Drubel, PA 4. SYMPTOMS: Do you have any symptoms? If Yes, ask: What symptoms are you having?  How bad are the symptoms (e.g., mild, moderate, severe)     Out of mediation x 2 days no SE at this time  5. PREGNANCY:  Is there any chance that you are pregnant? When was your last menstrual period?     Na  Protocols used: Medication Question Call-A-AH

## 2023-06-09 NOTE — Telephone Encounter (Signed)
 Patient called to f/u on her emergency script for  venlafaxine XR (EFFEXOR-XR) 37.5 MG 24 hr capsule. Patient said she has not had her meds in over 5 days now and really need them. Please f/u with patient

## 2023-06-09 NOTE — Telephone Encounter (Signed)
 Patient aware. She is going to stop by tomorrow morning

## 2023-06-09 NOTE — Addendum Note (Signed)
 Addended by: Debera Lat on: 06/09/2023 04:03 PM   Modules accepted: Orders

## 2023-06-09 NOTE — Telephone Encounter (Signed)
 Patient has been triaged for concern- see triage note 06/09/23

## 2023-06-16 ENCOUNTER — Ambulatory Visit: Payer: 59 | Admitting: Physician Assistant

## 2023-06-16 ENCOUNTER — Encounter: Payer: Self-pay | Admitting: Physician Assistant

## 2023-06-16 VITALS — BP 142/102 | HR 89 | Resp 16 | Ht 65.0 in | Wt 180.3 lb

## 2023-06-16 DIAGNOSIS — Z720 Tobacco use: Secondary | ICD-10-CM | POA: Diagnosis not present

## 2023-06-16 DIAGNOSIS — F1010 Alcohol abuse, uncomplicated: Secondary | ICD-10-CM

## 2023-06-16 DIAGNOSIS — R030 Elevated blood-pressure reading, without diagnosis of hypertension: Secondary | ICD-10-CM

## 2023-06-16 DIAGNOSIS — R5383 Other fatigue: Secondary | ICD-10-CM

## 2023-06-16 DIAGNOSIS — E66811 Obesity, class 1: Secondary | ICD-10-CM

## 2023-06-16 NOTE — Progress Notes (Signed)
 Established patient visit  Patient: Victoria [name]   DOB: 07/29/1975   48 y.o. Female  MRN: [id number] Visit Date: 06/16/2023  Today's healthcare provider: Blane Bunting, [name]   Chief Complaint  Patient presents with   Medical Management of Chronic Issues   Subjective      Discussed the use of AI scribe software for clinical note transcription with the patient, who gave verbal consent to proceed.  History of Present Illness   The patient presents for a discussion of preventative care and high blood pressure management. She expresses concern about the cost of preventative screenings due to a high insurance deductible. She is considering a colonoscopy, mammogram, and lung cancer screening. She last had a mammogram four to five years ago and has not had a colonoscopy or lung cancer screening. She has a long history of smoking.  The patient also reports frequent bowel movements, up to three to four times a day, which have been a long-standing issue. The stools are often soft or watery, especially later in the day. She denies any associated pain or discomfort. She also mentions some anxiety related to her job, but denies depression.           06/16/2023    2:11 PM 10/23/2021    9:03 AM 03/02/2019    3:11 PM  Depression screen PHQ 2/9  Decreased Interest 2 0 0  Down, Depressed, Hopeless 1 0 0  PHQ - 2 Score 3 0 0  Altered sleeping 3 0 0  Tired, decreased energy 3 2 3   Change in appetite 0 0 0  Feeling bad or failure about yourself  0 0 0  Trouble concentrating 0 1 0  Moving slowly or fidgety/restless 0 0 0  Suicidal thoughts 0 0 0  PHQ-9 Score 9 3 3   Difficult doing work/chores Not difficult at all Not difficult at all Not difficult at all      06/16/2023    2:11 PM  GAD 7 : Generalized Anxiety Score  Nervous, Anxious, on Edge 2  Control/stop worrying 1  Worry too much - different things 1  Trouble relaxing 0  Restless 0  Easily annoyed or irritable 2  Afraid -  awful might happen 0  Total GAD 7 Score 6  Anxiety Difficulty Not difficult at all    Medications: Outpatient Medications Prior to Visit  Medication Sig   meloxicam (MOBIC) 15 MG tablet Take 15 mg by mouth daily.   naproxen sodium (ALEVE) 220 MG tablet Take 220 mg by mouth.   venlafaxine  XR (EFFEXOR -XR) 37.5 MG 24 hr capsule TAKE 2 CAPSULES BY MOUTH DAILY WITH BREAKFAST. PLEASE SCHEDULE AN APPOINTMENT FOR FOLLOW UP   albuterol  (VENTOLIN  HFA) 108 (90 Base) MCG/ACT inhaler Inhale 1-2 puffs into the lungs every 6 (six) hours as needed.   benzonatate  (TESSALON ) 100 MG capsule Take 1 capsule (100 mg total) by mouth 3 (three) times daily as needed for cough.   No facility-administered medications prior to visit.    Review of Systems All negative Except see HPI       Objective    BP (!) 142/102 (BP Location: Right Arm, Patient Position: Sitting)   Pulse 89   Resp 16   Ht 5\' 5"  (1.651 m)   Wt 180 lb 4.8 oz (81.8 kg)   BMI 30.00 kg/m     Physical Exam Vitals reviewed.  Constitutional:      General: She is not in acute distress.    Appearance: Normal appearance.  She is well-developed. She is not diaphoretic.  HENT:     Head: Normocephalic and atraumatic.  Eyes:     General: No scleral icterus.    Conjunctiva/sclera: Conjunctivae normal.  Neck:     Thyroid: No thyromegaly.  Cardiovascular:     Rate and Rhythm: Normal rate and regular rhythm.     Pulses: Normal pulses.     Heart sounds: Normal heart sounds. No murmur heard. Pulmonary:     Effort: Pulmonary effort is normal. No respiratory distress.     Breath sounds: Normal breath sounds. No wheezing, rhonchi or rales.  Musculoskeletal:     Cervical back: Neck supple.     Right lower leg: No edema.     Left lower leg: No edema.  Lymphadenopathy:     Cervical: No cervical adenopathy.  Skin:    General: Skin is warm and dry.     Findings: No rash.  Neurological:     Mental Status: She is alert and oriented to person,  place, and time. Mental status is at baseline.  Psychiatric:        Mood and Affect: Mood normal.        Behavior: Behavior normal.      No results found for any visits on 06/16/23.      Assessment and Plan    Tobacco use Chronic Smoking cessation was addressed  Patient was advised to quit smoking  Patient seems reluctant. Will reassess at the next appt  Obesity (BMI 30.0-34.9) Chronic Body mass index is 30 kg/m. Initial workup - Lipid panel - Hemoglobin A1c - CBC with Differential/Platelet - Comprehensive metabolic panel - TSH - Vitamin B12 Will reassess after  receiving lab results Weight loss of 5% of pt's current weight via healthy diet and daily exercise encouraged.  Other fatigue Chronic Workup: - Lipid panel - Hemoglobin A1c - CBC with Differential/Platelet - Comprehensive metabolic panel - TSH - Vitamin B12 Will reassess after  receiving lab results  Alcohol abuse Chronic Will revisit at her next appointment  Elevated BP without diagnosis of hypertension new problem Elevated blood pressure readings (142/102) in the office. The patient has a history of smoking and is overweight, which are risk factors for hypertension. -Advise the patient to monitor blood pressure at home twice daily (morning and evening) for two weeks. -Bring blood pressure readings and device to the next appointment for comparison and validation.  Frequent Bowel Movements Reports frequent bowel movements, up to 3-4 times a day, with a change in consistency throughout the day. No other associated symptoms reported. -Advise dietary changes and monitoring of food intake to identify potential triggers.  Preventative Screenings The patient has not had recent preventative screenings and has a history of smoking. -Recommend colonoscopy, mammogram, and lung cancer screening (CT scan). -Advise the patient to check with insurance regarding coverage for preventative screenings. -Plan to  order these screenings at the next appointment in two weeks, pending insurance confirmation.   The patient has not had a recent CPE. -Schedule a CPE in two weeks during the follow-up appointment.  Laboratory Work The patient has not had recent lab work. -Order comprehensive lab work, to be completed fasting, before the next appointment in two weeks.      Orders Placed This Encounter  Procedures   Lipid panel    Has the patient fasted?:   Yes   Hemoglobin A1c   CBC with Differential/Platelet   Comprehensive metabolic panel    Has the patient fasted?:  Yes   TSH   Vitamin B12    Return in about 2 weeks (around 06/30/2023) for CPE, BP f/u.   The patient was advised to call back or seek an in-person evaluation if the symptoms worsen or if the condition fails to improve as anticipated.  I discussed the assessment and treatment plan with the patient. The patient was provided an opportunity to ask questions and all were answered. The patient agreed with the plan and demonstrated an understanding of the instructions.  I, Camren Lipsett, [name] have reviewed all documentation for this visit. The documentation on 06/16/2023  for the exam, diagnosis, procedures, and orders are all accurate and complete.  Blane Bunting, [facility], MMS [facility] [phone] (phone) [phone] (fax)  [facility]

## 2023-06-22 DIAGNOSIS — M1711 Unilateral primary osteoarthritis, right knee: Secondary | ICD-10-CM | POA: Diagnosis not present

## 2023-06-26 ENCOUNTER — Other Ambulatory Visit: Payer: Self-pay | Admitting: Family Medicine

## 2023-06-26 DIAGNOSIS — N951 Menopausal and female climacteric states: Secondary | ICD-10-CM

## 2023-06-28 DIAGNOSIS — E66811 Obesity, class 1: Secondary | ICD-10-CM | POA: Diagnosis not present

## 2023-06-28 DIAGNOSIS — R5383 Other fatigue: Secondary | ICD-10-CM | POA: Diagnosis not present

## 2023-06-28 NOTE — Telephone Encounter (Signed)
Requested medication (s) are due for refill today:   Yes  Requested medication (s) are on the active medication list:   Yes  Future visit scheduled:   Yes 07/01/2023 with Edmon Crape   Last ordered: 06/09/2023 #30, 0 refill courtesy supply  Unable to refill because labs are due.      Requested Prescriptions  Pending Prescriptions Disp Refills   venlafaxine XR (EFFEXOR-XR) 37.5 MG 24 hr capsule [Pharmacy Med Name: VENLAFAXINE HCL ER 37.5 MG CAP] 30 capsule 0    Sig: TAKE 2 CAPSULES BY MOUTH ONCE DAILY WITH BREAKFAST. PLEASE SCHEDULE FOLLOW UP APPOINTMENT     Psychiatry: Antidepressants - SNRI - desvenlafaxine & venlafaxine Failed - 06/28/2023  2:45 PM      Failed - Cr in normal range and within 360 days    Creatinine, Ser  Date Value Ref Range Status  10/23/2021 0.58 0.57 - 1.00 mg/dL Final         Failed - Last BP in normal range    BP Readings from Last 1 Encounters:  06/16/23 (!) 142/102         Failed - Lipid Panel in normal range within the last 12 months    Cholesterol, Total  Date Value Ref Range Status  03/03/2019 202 (H) 100 - 199 mg/dL Final   LDL Chol Calc (NIH)  Date Value Ref Range Status  03/03/2019 112 (H) 0 - 99 mg/dL Final   HDL  Date Value Ref Range Status  03/03/2019 77 >39 mg/dL Final   Triglycerides  Date Value Ref Range Status  03/03/2019 72 0 - 149 mg/dL Final         Passed - Valid encounter within last 6 months    Recent Outpatient Visits           1 week ago Elevated BP without diagnosis of hypertension   [facility] Dillon, [place], [name]   1 year ago Vasomotor symptoms due to menopause   [facility] Alfredia Ferguson, [name]   1 year ago [facility])   [facility] Alfredia Ferguson, [name]   4 years ago Annual physical exam   [facility] Shannon, Alessandra Bevels, [place]   5 years ago Annual physical exam   [facility]  Ivan, Alessandra [place], [place]       Future Appointments             In 3 days Debera Lat, [name] [facility], [facility]

## 2023-06-29 ENCOUNTER — Encounter: Payer: Self-pay | Admitting: Physician Assistant

## 2023-06-29 ENCOUNTER — Other Ambulatory Visit: Payer: Self-pay | Admitting: Physician Assistant

## 2023-06-29 DIAGNOSIS — N951 Menopausal and female climacteric states: Secondary | ICD-10-CM

## 2023-06-29 LAB — CBC WITH DIFFERENTIAL/PLATELET
Basophils Absolute: 0.1 10*3/uL (ref 0.0–0.2)
Basos: 1 %
EOS (ABSOLUTE): 0.4 10*3/uL (ref 0.0–0.4)
Eos: 4 %
Hematocrit: 42 % (ref 34.0–46.6)
Hemoglobin: 14.5 g/dL (ref 11.1–15.9)
Immature Grans (Abs): 0 10*3/uL (ref 0.0–0.1)
Immature Granulocytes: 0 %
Lymphocytes Absolute: 3.8 10*3/uL — ABNORMAL HIGH (ref 0.7–3.1)
Lymphs: 34 %
MCH: 34.2 pg — ABNORMAL HIGH (ref 26.6–33.0)
MCHC: 34.5 g/dL (ref 31.5–35.7)
MCV: 99 fL — ABNORMAL HIGH (ref 79–97)
Monocytes Absolute: 1.5 10*3/uL — ABNORMAL HIGH (ref 0.1–0.9)
Monocytes: 14 %
Neutrophils Absolute: 5.2 10*3/uL (ref 1.4–7.0)
Neutrophils: 47 %
Platelets: 642 10*3/uL — ABNORMAL HIGH (ref 150–450)
RBC: 4.24 x10E6/uL (ref 3.77–5.28)
RDW: 11.1 % — ABNORMAL LOW (ref 11.7–15.4)
WBC: 11 10*3/uL — ABNORMAL HIGH (ref 3.4–10.8)

## 2023-06-29 LAB — COMPREHENSIVE METABOLIC PANEL
ALT: 17 [IU]/L (ref 0–32)
AST: 18 [IU]/L (ref 0–40)
Albumin: 4.1 g/dL (ref 3.9–4.9)
Alkaline Phosphatase: 82 [IU]/L (ref 44–121)
BUN/Creatinine Ratio: 22 (ref 9–23)
BUN: 14 mg/dL (ref 6–24)
Bilirubin Total: 0.2 mg/dL (ref 0.0–1.2)
CO2: 21 mmol/L (ref 20–29)
Calcium: 9.6 mg/dL (ref 8.7–10.2)
Chloride: 107 mmol/L — ABNORMAL HIGH (ref 96–106)
Creatinine, Ser: 0.63 mg/dL (ref 0.57–1.00)
Globulin, Total: 2.6 g/dL (ref 1.5–4.5)
Glucose: 96 mg/dL (ref 70–99)
Potassium: 5.2 mmol/L (ref 3.5–5.2)
Sodium: 142 mmol/L (ref 134–144)
Total Protein: 6.7 g/dL (ref 6.0–8.5)
eGFR: 108 mL/min/{1.73_m2} (ref >59)

## 2023-06-29 LAB — LIPID PANEL
Chol/HDL Ratio: 2.7 {ratio} (ref 0.0–4.4)
Cholesterol, Total: 185 mg/dL (ref 100–199)
HDL: 69 mg/dL (ref >39)
LDL Chol Calc (NIH): 101 mg/dL — ABNORMAL HIGH (ref 0–99)
Triglycerides: 80 mg/dL (ref 0–149)
VLDL Cholesterol Cal: 15 mg/dL (ref 5–40)

## 2023-06-29 LAB — TSH: TSH: 2.71 u[IU]/mL (ref 0.450–4.500)

## 2023-06-29 LAB — VITAMIN B12: Vitamin B-12: 190 pg/mL — ABNORMAL LOW (ref 232–1245)

## 2023-06-29 LAB — HEMOGLOBIN A1C
Est. average glucose Bld gHb Est-mCnc: 117 mg/dL
Hgb A1c MFr Bld: 5.7 % — ABNORMAL HIGH (ref 4.8–5.6)

## 2023-07-01 ENCOUNTER — Ambulatory Visit (INDEPENDENT_AMBULATORY_CARE_PROVIDER_SITE_OTHER): Payer: 59 | Admitting: Physician Assistant

## 2023-07-01 ENCOUNTER — Encounter: Payer: Self-pay | Admitting: Physician Assistant

## 2023-07-01 VITALS — BP 138/104 | HR 79 | Resp 20 | Ht 65.0 in | Wt 186.2 lb

## 2023-07-01 DIAGNOSIS — R8290 Unspecified abnormal findings in urine: Secondary | ICD-10-CM | POA: Diagnosis not present

## 2023-07-01 DIAGNOSIS — F17200 Nicotine dependence, unspecified, uncomplicated: Secondary | ICD-10-CM

## 2023-07-01 DIAGNOSIS — G5603 Carpal tunnel syndrome, bilateral upper limbs: Secondary | ICD-10-CM | POA: Diagnosis not present

## 2023-07-01 DIAGNOSIS — R0981 Nasal congestion: Secondary | ICD-10-CM

## 2023-07-01 DIAGNOSIS — Z1231 Encounter for screening mammogram for malignant neoplasm of breast: Secondary | ICD-10-CM

## 2023-07-01 DIAGNOSIS — Z0001 Encounter for general adult medical examination with abnormal findings: Secondary | ICD-10-CM

## 2023-07-01 DIAGNOSIS — Z1211 Encounter for screening for malignant neoplasm of colon: Secondary | ICD-10-CM

## 2023-07-01 DIAGNOSIS — Z0000 Encounter for general adult medical examination without abnormal findings: Secondary | ICD-10-CM

## 2023-07-01 DIAGNOSIS — I10 Essential (primary) hypertension: Secondary | ICD-10-CM

## 2023-07-01 LAB — POCT URINALYSIS DIPSTICK
Bilirubin, UA: NEGATIVE
Blood, UA: NEGATIVE
Glucose, UA: NEGATIVE
Ketones, UA: NEGATIVE
Leukocytes, UA: NEGATIVE
Nitrite, UA: NEGATIVE
Protein, UA: NEGATIVE
Spec Grav, UA: <=1.005 — AB (ref 1.010–1.025)
Urobilinogen, UA: 0.2 U/dL
pH, UA: 7.5 (ref 5.0–8.0)

## 2023-07-01 MED ORDER — VALSARTAN 40 MG PO TABS: 40.0000 mg | ORAL_TABLET | Freq: Every day | ORAL | 3 refills | Status: DC

## 2023-07-01 NOTE — Progress Notes (Signed)
Complete physical exam  Patient: Victoria [name]   DOB: [birth year]   48 y.o. Female  MRN: [id number] Visit Date: 07/01/2023  Today's healthcare provider: Debera Lat, [name]   Chief Complaint  Patient presents with   Annual Exam   Hypertension   Subjective    Victoria [name] is a 48 y.o. female who presents today for a complete physical exam.   Discussed the use of AI scribe software for clinical note transcription with the patient, who gave verbal consent to proceed.  History of Present Illness   The patient, with a history of hypertension, obesity, and smoking, presents with concerns about elevated blood pressure readings taken at home. The patient reports some readings with a diastolic pressure over 100. The patient has made lifestyle changes, including reducing alcohol consumption and smoking, but struggles with weight loss due to knee problems requiring double knee replacement. The patient also reports a significant reduction in alcohol consumption and a decrease in smoking from a pack and a half to just under a pack a day.  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 The patient has been experiencing nasal congestion for about a month, which has not resolved despite taking medication. The patient also reports occasional sneezing.  The patient also mentions a history of abnormal urinalysis results, which were suggestive of a urinary tract infection, but the patient did not have any symptoms. The patient is also on meloxicam daily and has noticed blood spots on the arms and excessive bleeding, which has been attributed to the medication.        Last depression screening scores    07/01/2023    9:17 AM 06/16/2023    2:11 PM 10/23/2021    9:03 AM  PHQ 2/9 Scores  PHQ - 2 Score 2 3 0  PHQ- 9 Score 8 9 3    Last fall risk screening    07/01/2023    9:17 AM  Fall Risk   Falls in the past year? 1  Number falls in  past yr: 0  Injury with Fall? 0   Last Audit-C alcohol use screening    06/14/2023    4:32 PM  Alcohol Use Disorder Test (AUDIT)  1. How often do you have a drink containing alcohol? 4  2. How many drinks containing alcohol do you have on a typical day when you are drinking? 0  3. How often do you have six or more drinks on one occasion? 0  AUDIT-C Score 4   4. How often during the last year have you found that you were not able to stop drinking once you had started? 0  5. How often during the last year have you failed to do what was normally expected from you because of drinking? 0  6. How often during the last year have you needed a first drink in the morning to get yourself going after a heavy drinking session? 0  7. How often during the last year have you had a feeling of guilt of remorse after drinking? 0  8. How often during the last year have you been unable to remember what happened the night before because you had been drinking? 0  9. Have you or someone else been injured as a result of your drinking? 0  10. Has a relative or friend or a doctor or another health worker been concerned about your drinking or suggested you cut down? 2  Alcohol Use Disorder Identification Test Final Score (AUDIT) 6      Patient-reported   A score of 3 or more in women, and 4 or more in men indicates increased risk for alcohol abuse, EXCEPT if all of the points are from question 1   Past Medical History:  Diagnosis Date   Allergy    Fatty liver    Lupus    Past Surgical History:  Procedure Laterality Date   ABDOMINAL HYSTERECTOMY  2006   still has ovaries   SPLENECTOMY, TOTAL  1997   Social History   Socioeconomic History   Marital status: Married    Spouse name: Not on file   Number of children: Not on file   Years of education: Not on file   Highest education level: GED or equivalent  Occupational History   Not on file  Tobacco Use   Smoking status: Every Day    Current  packs/day: 1.00    Average packs/day: 1 pack/day for 31.0 years (31.0 ttl pk-yrs)    Types: Cigarettes   Smokeless tobacco: Never   Tobacco comments:    patient not ready to quit  Vaping Use   Vaping status: Never Used  Substance and Sexual Activity   Alcohol use: Yes    Alcohol/week: 10.0 standard drinks of alcohol    Types: 10 Glasses of wine per week   Drug use: No   Sexual activity: Not on file  Other Topics Concern   Not on file  Social History Narrative   Not on file   Social Drivers of Health   Financial Resource Strain: Low Risk  (06/14/2023)   Overall Financial Resource Strain (CARDIA)    Difficulty of Paying Living Expenses: Not hard at all  Food Insecurity: No Food Insecurity (06/14/2023)   Hunger Vital Sign    Worried About Running Out of Food in the Last Year: Never true    Ran Out of Food in the Last Year: Never true  Transportation Needs: No Transportation Needs (06/14/2023)   PRAPARE - Administrator, Civil Service (Medical): No    Lack of Transportation (Non-Medical): No  Physical Activity: Unknown (06/14/2023)   Exercise Vital Sign    Days of Exercise per Week: Patient declined    Minutes of Exercise per Session: Not on file  Stress: Stress Concern Present (06/14/2023)   [employer] of Occupational Health - Occupational Stress Questionnaire    Feeling of Stress : To some extent  Social Connections: Unknown (06/14/2023)   Social Connection and Isolation Panel [NHANES]    Frequency of Communication with Friends and Family: Three times a week    Frequency of Social Gatherings with Friends and Family: Patient declined    Attends Religious Services: Patient declined    Database administrator or Organizations: No    Attends Engineer, structural: Not on file    Marital Status: Married  Catering manager Violence: Not on file   Family Status  Relation Name Status   Mother  Alive   Father  Deceased at age 40   MGM  (Not Specified)    PGM  (Not Specified)   PGF  (Not Specified)   Brother  Alive  No partnership data on file   Family History  Problem Relation Age of Onset   Diabetes Mother    Hypertension Mother    Pancreatic cancer Father    Diabetes Maternal Grandmother    Heart disease Maternal Grandmother  Heart disease Paternal Grandmother    Heart disease Paternal Grandfather    Liver disease Brother    Allergies  Allergen Reactions   Oxycodone Hcl     Patient Care Team: Cherlynn Polo as PCP - General (Physician Assistant)   Medications: Outpatient Medications Prior to Visit  Medication Sig   meloxicam (MOBIC) 15 MG tablet Take 15 mg by mouth daily.   venlafaxine XR (EFFEXOR-XR) 37.5 MG 24 hr capsule TAKE 2 CAPSULES BY MOUTH ONCE DAILY WITH BREAKFAST. PLEASE SCHEDULE FOLLOW UP APPOINTMENT   [DISCONTINUED] naproxen sodium (ALEVE) 220 MG tablet Take 220 mg by mouth.   No facility-administered medications prior to visit.    Review of Systems  All other systems reviewed and are negative.  Except see HPI     Objective    BP (!) 138/104   Pulse 79   Resp 20   Ht 5\' 5"  (1.651 m)   Wt 186 lb 3.2 oz (84.5 kg)   SpO2 98%   BMI 30.99 kg/m      Physical Exam Vitals reviewed.  Constitutional:      General: She is not in acute distress.    Appearance: Normal appearance. She is well-developed. She is not ill-appearing, toxic-appearing or diaphoretic.  HENT:     Head: Normocephalic and atraumatic.     Right Ear: Tympanic membrane, ear canal and external ear normal.     Left Ear: Tympanic membrane, ear canal and external ear normal.     Nose: Nose normal. No congestion or rhinorrhea.     Mouth/Throat:     Mouth: Mucous membranes are moist.     Pharynx: Oropharynx is clear. No oropharyngeal exudate.  Eyes:     General: No scleral icterus.       Right eye: No discharge.        Left eye: No discharge.     Conjunctiva/sclera: Conjunctivae normal.     Pupils: Pupils are equal, round,  and reactive to light.  Neck:     Thyroid: No thyromegaly.     Vascular: No carotid bruit.  Cardiovascular:     Rate and Rhythm: Normal rate and regular rhythm.     Pulses: Normal pulses.     Heart sounds: Normal heart sounds. No murmur heard.    No friction rub. No gallop.  Pulmonary:     Effort: Pulmonary effort is normal. No respiratory distress.     Breath sounds: Normal breath sounds. No wheezing or rales.  Abdominal:     General: Abdomen is flat. Bowel sounds are normal. There is no distension.     Palpations: Abdomen is soft. There is no mass.     Tenderness: There is no abdominal tenderness. There is no right CVA tenderness, left CVA tenderness, guarding or rebound.     Hernia: No hernia is present.  Musculoskeletal:        General: No swelling, tenderness, deformity or signs of injury. Normal range of motion.     Cervical back: Normal range of motion and neck supple. No rigidity or tenderness.     Right lower leg: No edema.     Left lower leg: No edema.  Lymphadenopathy:     Cervical: No cervical adenopathy.  Skin:    General: Skin is warm and dry.     Coloration: Skin is not jaundiced or pale.     Findings: No bruising, erythema, lesion or rash.  Neurological:     Mental Status: She is alert and oriented  to person, place, and time. Mental status is at baseline.     Gait: Gait normal.  Psychiatric:        Mood and Affect: Mood normal.        Behavior: Behavior normal.        Thought Content: Thought content normal.        Judgment: Judgment normal.     Breast and pelvic exams were deferred Results for orders placed or performed in visit on 07/01/23  POCT Urinalysis Dipstick  Result Value Ref Range   Color, UA yellow    Clarity, UA clear    Glucose, UA Negative Negative   Bilirubin, UA Negative    Ketones, UA Negative    Spec Grav, UA <=1.005 (A) 1.010 - 1.025   Blood, UA Negative    pH, UA 7.5 5.0 - 8.0   Protein, UA Negative Negative   Urobilinogen, UA 0.2  0.2 or 1.0 E.U./dL   Nitrite, UA Negative    Leukocytes, UA Negative Negative   Appearance     Odor      Assessment & Plan    Routine Health Maintenance and Physical Exam  Exercise Activities and Dietary recommendations  Goals   None     Immunization History  Administered Date(s) Administered   DTaP 07/03/2014   Tdap 07/03/2014    Health Maintenance  Topic Date Due   Pneumococcal Vaccination (1 of 2 - PCV) Never done   Yearly kidney health urinalysis for diabetes  Never done   Hepatitis C Screening  Never done   Colon Cancer Screening  Never done   Flu Shot  Never done   COVID-19 Vaccine (1 - 2024-25 season) Never done   Screening for Lung Cancer  06/11/2023   Mammogram  06/11/2023   Zoster (Shingles) Vaccine (1 of 2) Never done   Yearly kidney function blood test for diabetes  06/27/2024   DTaP/Tdap/Td vaccine (3 - Td or Tdap) 07/03/2024   HIV Screening  Completed   HPV Vaccine  Aged Out    Discussed health benefits of physical activity, and encouraged her to engage in regular exercise appropriate for her age and condition.  Assessment and Plan   Abnormal urinalysis Per pt it was abnormal during her visit with rheumatology? Requested UA - POCT Urinalysis Dipstick Negative  Breast cancer screening by mammogram - MM 3D Screening Breast Bilateral - [facility]; Future  Colon cancer screening - Ambulatory referral to gastroenterology for colonoscopy  Annual physical exam (Primary) UTD on dental/eye? Things to do to keep yourself healthy  - Exercise at least 30-45 minutes a day, 3-4 days a week.  - Eat a low-fat diet with lots of fruits and vegetables, up to 7-9 servings per day.  - Seatbelts can save your life. Wear them always.  - Smoke detectors on every level of your home, check batteries every year.  - Eye Doctor - have an eye exam every 1-2 years  - Safe sex - if you may be exposed to STDs, use a condom.  - Alcohol -  If you drink, do it  moderately, less than 2 drinks per day.  - Health Care Power of Attorney. Choose someone to speak for you if you are not able.  - Depression is common in our stressful world.If you're feeling down or losing interest in things you normally enjoy, please come in for a visit.  - Violence - If anyone is threatening or hurting you, please call immediately.  Primary hypertension - valsartan (DIOVAN) 40 MG tablet; Take 1 tablet (40 mg total) by mouth daily.  Dispense: 90 tablet; Refill: 3  Hypertension Elevated blood pressure readings. Discussed the importance of consistent medication adherence and lifestyle modifications including diet and exercise. -Start on low-dose antihypertensive medication, half tablet daily for one week. -Monitor blood pressure and adjust dosage as needed. -Follow-up in 4-6 weeks for blood pressure adjustment.  Smoking and Alcohol Use Patient has reduced alcohol consumption and smoking but continues to use both. Discussed the importance of cessation and provided resources for support. -Continue efforts to reduce and eventually quit smoking and alcohol use. -Explore resources provided by CDC's Quit Smoking program for additional support. Referral to lung cancer screening  Chronic Sinus Congestion Persistent congestion for over a month. No signs of infection on examination. -Try over-the-counter antihistamines (Allegra, Claritin, Xartax) and nasal saline rinse. -Consider using an air humidifier.  Obesity chronic, associated with htn and elevated cholesterol Patient has difficulty with exercise due to knee problems and a busy work schedule. Discussed the importance of diet and potential for weight loss program. -Continue efforts to improve diet. -Check with insurance about coverage for weight loss program.  Hyperlipidemia Slightly elevated cholesterol levels. Discussed the importance of diet and potential need for medication in the future. -Continue efforts to improve  diet. -Monitor cholesterol levels.  Carpal Tunnel Syndrome Occasional tingling in hands at night. -Use wrist braces during sleep. -Try alternating cold and hot water treatments.  General Health Maintenance -Plan for colonoscopy and lung cancer screening. -Check Hepatitis C screening. -Continue to monitor white blood cell count and platelet count. -Check A1c in three months. -Schedule mammogram. -Follow-up in one year for physical.      The 10-year ASCVD risk score (Arnett DK, et al., 2019) is: 2.8%   Return in about 1 year (around 06/30/2024) for CPE in 1 year, bp fu in 4-6 weeks.    The patient was advised to call back or seek an in-person evaluation if the symptoms worsen or if the condition fails to improve as anticipated.  I discussed the assessment and treatment plan with the patient. The patient was provided an opportunity to ask questions and all were answered. The patient agreed with the plan and demonstrated an understanding of the instructions.  I, Debera Lat, [name] have reviewed all documentation for this visit. The documentation on 07/01/2023  for the exam, diagnosis, procedures, and orders are all accurate and complete.  Debera Lat, [facility], MMS [facility] [phone] (phone) [phone] (fax)  [facility]

## 2023-07-07 IMAGING — CR DG CHEST 2V
1 series · 2 of 2 positions shown · non-contrast
Comparison: Chest radiograph 01/20/2007

CLINICAL DATA: Night sweats.  Smoking history.

EXAM:
CHEST - 2 VIEW

[Series 1: dg chest 2 view · 0.14mm/px · 2 of 2 slices shown]
[im 1/2]
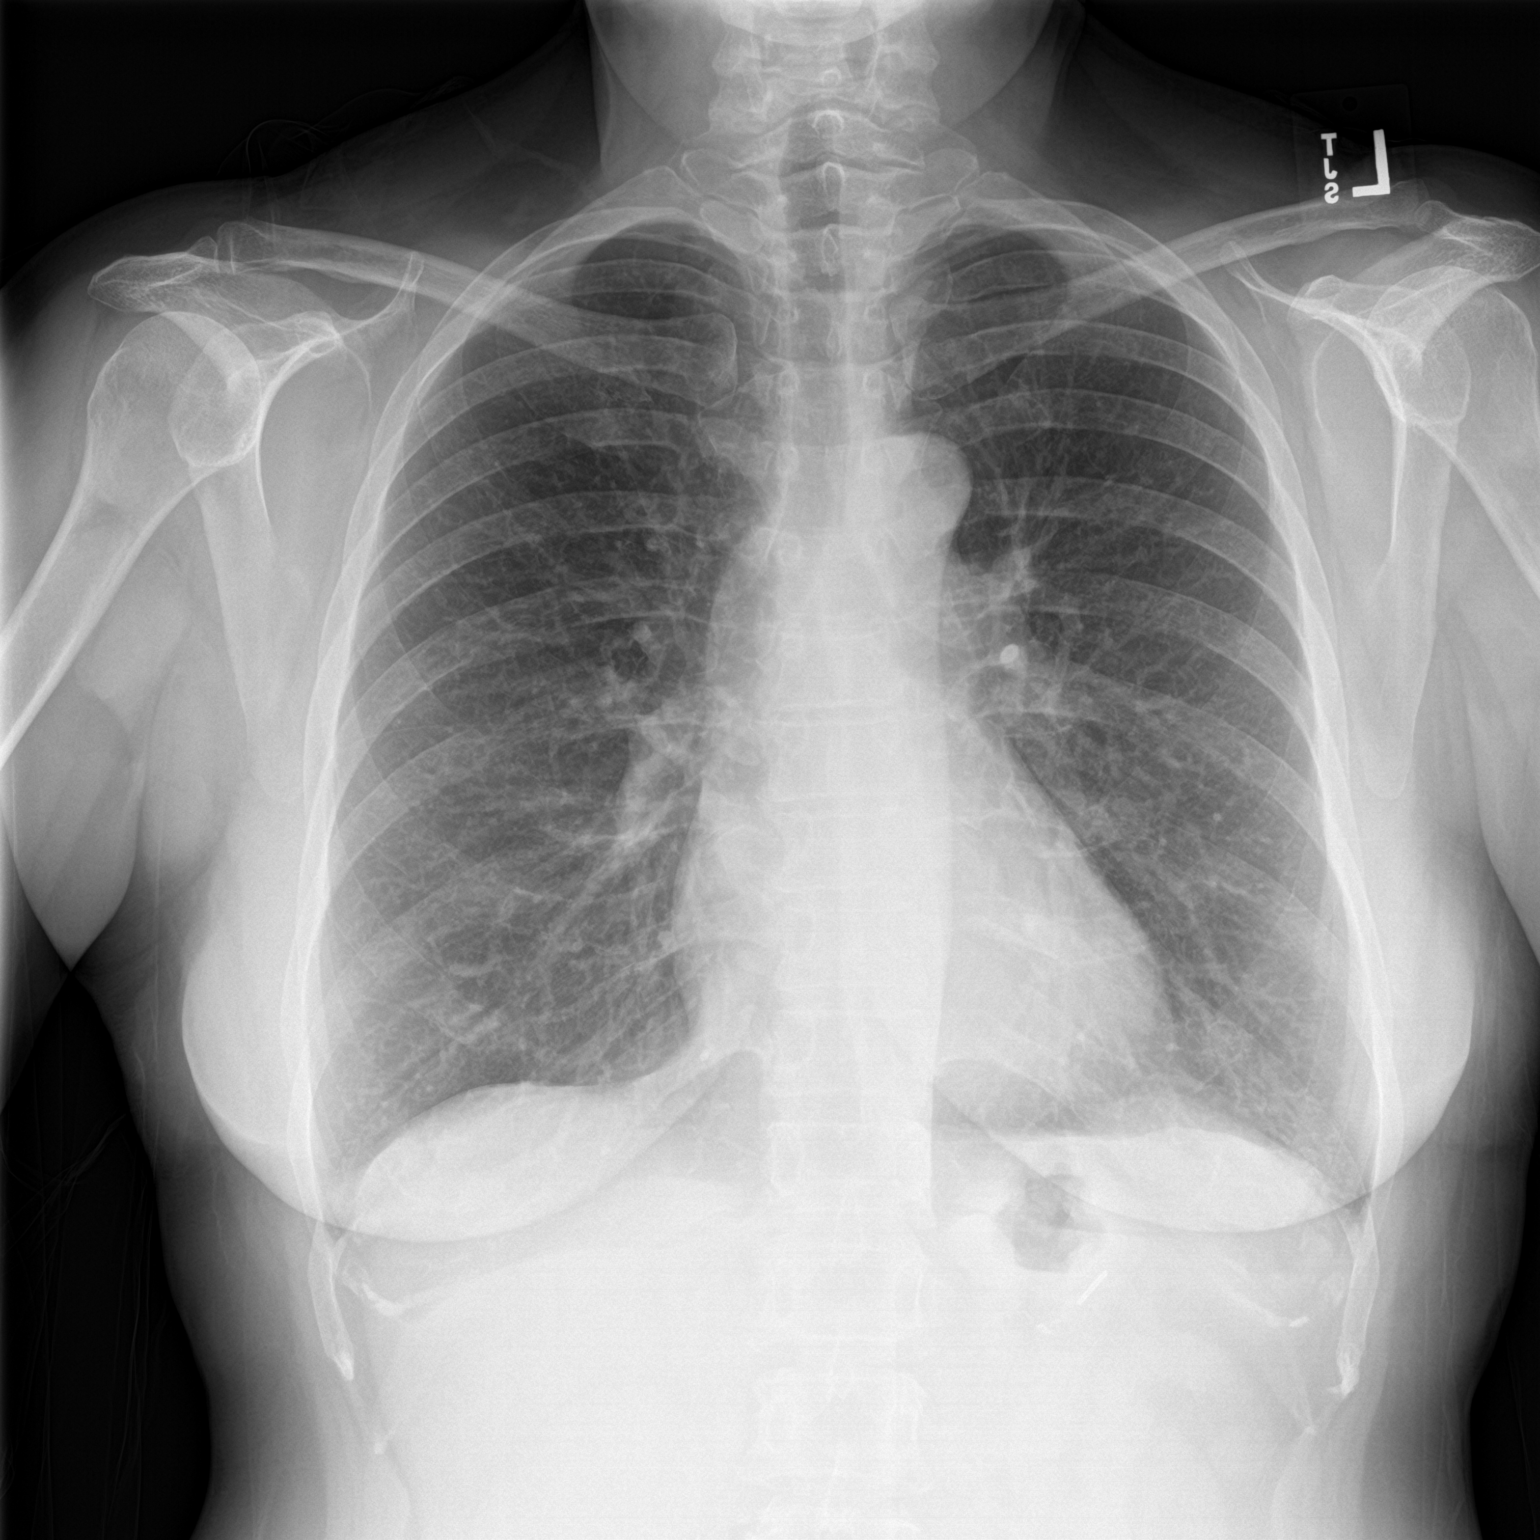
[im 2/2]
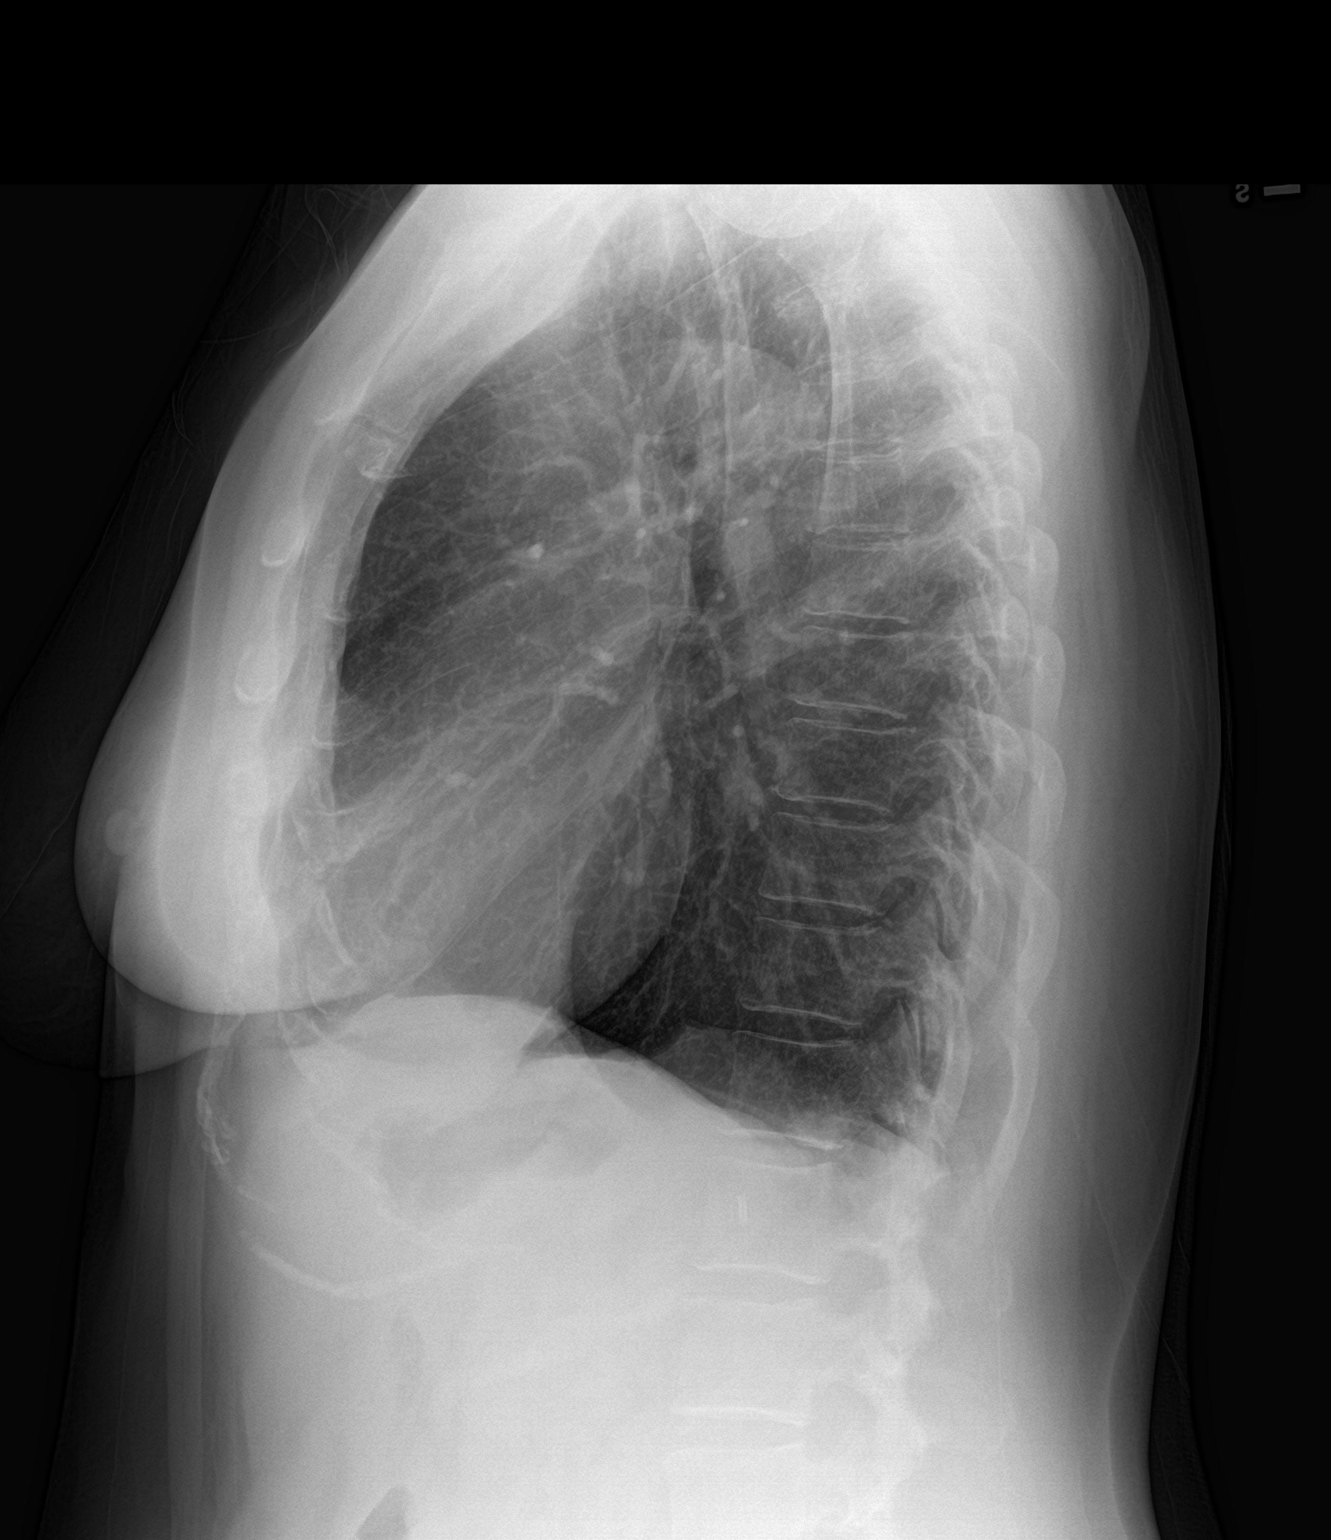

[2 of 2 positions shown; findings below may reference images not displayed]

FINDINGS: The heart size and mediastinal contours are within normal limits.
Both lungs are clear. The visualized skeletal structures are
unremarkable.
IMPRESSION: No active cardiopulmonary disease.

## 2023-07-13 ENCOUNTER — Encounter: Payer: Self-pay | Admitting: *Deleted

## 2023-07-23 ENCOUNTER — Other Ambulatory Visit: Payer: Self-pay | Admitting: Physician Assistant

## 2023-07-23 DIAGNOSIS — N951 Menopausal and female climacteric states: Secondary | ICD-10-CM

## 2023-07-26 NOTE — Telephone Encounter (Signed)
 Requested Prescriptions  Pending Prescriptions Disp Refills   venlafaxine XR (EFFEXOR-XR) 37.5 MG 24 hr capsule [Pharmacy Med Name: VENLAFAXINE HCL ER 37.5 MG CAP] 180 capsule 0    Sig: TAKE 2 CAPSULES BY MOUTH ONCE DAILY WITH BREAKFAST. PLEASE SCHEDULE FOLLOW UP APPOINTMENT     Psychiatry: Antidepressants - SNRI - desvenlafaxine & venlafaxine Failed - 07/26/2023  8:07 AM      Failed - Last BP in normal range    BP Readings from Last 1 Encounters:  07/01/23 (!) 138/104         Failed - Lipid Panel in normal range within the last 12 months    Cholesterol, Total  Date Value Ref Range Status  06/28/2023 185 100 - 199 mg/dL Final   LDL Chol Calc (NIH)  Date Value Ref Range Status  06/28/2023 101 (H) 0 - 99 mg/dL Final   HDL  Date Value Ref Range Status  06/28/2023 69 >39 mg/dL Final   Triglycerides  Date Value Ref Range Status  06/28/2023 80 0 - 149 mg/dL Final         Passed - Cr in normal range and within 360 days    Creatinine, Ser  Date Value Ref Range Status  06/28/2023 0.63 0.57 - 1.00 mg/dL Final         Passed - Valid encounter within last 6 months    Recent Outpatient Visits           3 weeks ago Annual physical exam   [facility], [place], [name]   1 month ago Elevated BP without diagnosis of hypertension   [facility] [place], [place], [name]   1 year ago Vasomotor symptoms due to menopause   [facility] Alfredia Ferguson, [name]   1 year ago [facility])   [facility] Alfredia Ferguson, [name]   4 years ago Annual physical exam   [facility], Alessandra [place]vel[place], [place]       Future Appointments             In 2 weeks Debera Lat, [name] [facility], [facility]

## 2023-08-09 ENCOUNTER — Ambulatory Visit (INDEPENDENT_AMBULATORY_CARE_PROVIDER_SITE_OTHER): Payer: Self-pay | Admitting: Physician Assistant

## 2023-08-09 ENCOUNTER — Encounter: Payer: Self-pay | Admitting: Physician Assistant

## 2023-08-09 VITALS — BP 132/86 | HR 68 | Temp 98.1°F | Resp 16 | Ht 65.0 in | Wt 174.1 lb

## 2023-08-09 DIAGNOSIS — E7849 Other hyperlipidemia: Secondary | ICD-10-CM

## 2023-08-09 DIAGNOSIS — Z720 Tobacco use: Secondary | ICD-10-CM | POA: Diagnosis not present

## 2023-08-09 DIAGNOSIS — F1010 Alcohol abuse, uncomplicated: Secondary | ICD-10-CM

## 2023-08-09 DIAGNOSIS — E663 Overweight: Secondary | ICD-10-CM | POA: Diagnosis not present

## 2023-08-09 DIAGNOSIS — I10 Essential (primary) hypertension: Secondary | ICD-10-CM

## 2023-08-09 DIAGNOSIS — N951 Menopausal and female climacteric states: Secondary | ICD-10-CM

## 2023-08-09 NOTE — Progress Notes (Signed)
 Established patient visit  Patient: Victoria [name]   DOB: 24-Nov-1975   48 y.o. Female  MRN: [id number] Visit Date: 08/09/2023  Today's healthcare provider: Debera Lat, [name]   Chief Complaint  Patient presents with   Follow-up    F/u 6 week BP .Marland Kitchen No concerns    Subjective      Discussed the use of AI scribe software for clinical note transcription with the patient, who gave verbal consent to proceed.  History of Present Illness   The patient, with a history of hypertension, high cholesterol, smoking, alcohol use, and knee arthritis, presents for a follow-up visit. The patient reports a significant weight loss of twelve pounds over the past five weeks due to a low-carb diet. The patient's hypertension is managed with valsartan, which has helped to lower her blood pressure. The patient continues to smoke about a pack of cigarettes a day and admits to daily alcohol use, though the quantity varies. The patient also reports knee pain due to arthritis, which has been evaluated by an orthopedic specialist. The patient was advised that she needs surgery, but it has been postponed due to her age. The patient is wearing a brace in the meantime, which she hopes will provide some relief. The patient also takes venlafaxine for menopause symptoms.          08/09/2023    9:27 AM 07/01/2023    9:17 AM 06/16/2023    2:11 PM  Depression screen PHQ 2/9  Decreased Interest 1 1 2   Down, Depressed, Hopeless 0 1 1  PHQ - 2 Score 1 2 3   Altered sleeping 1 2 3   Tired, decreased energy 2 3 3   Change in appetite 0 0 0  Feeling bad or failure about yourself  0 0 0  Trouble concentrating 1 1 0  Moving slowly or fidgety/restless 0 0 0  Suicidal thoughts 0 0 0  PHQ-9 Score 5 8 9   Difficult doing work/chores Not difficult at all Not difficult at all Not difficult at all      08/09/2023    9:28 AM 07/01/2023    9:17 AM 06/16/2023    2:11 PM  GAD 7 : Generalized Anxiety Score  Nervous, Anxious,  on Edge 1 2 2   Control/stop worrying 1 2 1   Worry too much - different things 1 2 1   Trouble relaxing 0 0 0  Restless 0 0 0  Easily annoyed or irritable 1 3 2   Afraid - awful might happen 0 0 0  Total GAD 7 Score 4 9 6   Anxiety Difficulty Not difficult at all Not difficult at all Not difficult at all    Medications: Outpatient Medications Prior to Visit  Medication Sig   meloxicam (MOBIC) 15 MG tablet Take 15 mg by mouth daily.   valsartan (DIOVAN) 40 MG tablet Take 1 tablet (40 mg total) by mouth daily.   venlafaxine XR (EFFEXOR-XR) 37.5 MG 24 hr capsule TAKE 2 CAPSULES BY MOUTH ONCE DAILY WITH BREAKFAST. PLEASE SCHEDULE FOLLOW UP APPOINTMENT   No facility-administered medications prior to visit.    Review of Systems  All other systems reviewed and are negative.  All negative Except see HPI       Objective    BP 132/86 (BP Location: Right Arm, Patient Position: Sitting)   Pulse 68   Temp 98.1 F (36.7 C) (Oral)   Resp 16   Ht 5\' 5"  (1.651 m)   Wt 174 lb 1.6 oz (79 kg)  SpO2 98%   BMI 28.97 kg/m     Physical Exam Vitals reviewed.  Constitutional:      General: She is not in acute distress.    Appearance: Normal appearance. She is well-developed. She is not diaphoretic.  HENT:     Head: Normocephalic and atraumatic.  Eyes:     General: No scleral icterus.    Conjunctiva/sclera: Conjunctivae normal.  Neck:     Thyroid: No thyromegaly.  Cardiovascular:     Rate and Rhythm: Normal rate and regular rhythm.     Pulses: Normal pulses.     Heart sounds: Normal heart sounds. No murmur heard. Pulmonary:     Effort: Pulmonary effort is normal. No respiratory distress.     Breath sounds: Normal breath sounds. No wheezing, rhonchi or rales.  Musculoskeletal:     Cervical back: Neck supple.     Right lower leg: No edema.     Left lower leg: No edema.  Lymphadenopathy:     Cervical: No cervical adenopathy.  Skin:    General: Skin is warm and dry.     Findings:  No rash.  Neurological:     Mental Status: She is alert and oriented to person, place, and time. Mental status is at baseline.  Psychiatric:        Mood and Affect: Mood normal.        Behavior: Behavior normal.      No results found for any visits on 08/09/23.      Assessment and Plan    Hypertension Blood pressure slightly elevated despite Valsartan 40mg  daily. Discussed the importance of consistent medication use, low sodium diet, and weight loss. -Continue Valsartan 40mg  daily. -Monitor blood pressure at home and report readings at next visit. -Consider addition of diuretic if blood pressure remains elevated.  Overweight/Weight Management Significant weight loss (12 pounds in 5 weeks) through low carb diet. -Continue low carb diet and increase physical activity as tolerated/"walks a lot at work".  Smoking Current smoker (1 pack/day) x 33 years. Discussed the benefits of smoking cessation and resources available. -Encouraged to utilize CDC quit smoking resources including counseling and nicotine replacement therapy. Admits that it has been challenging. Will revisit  Alcohol Use Daily alcohol consumption (1-2 glasses of wine). -Continue to monitor and reduce alcohol intake. Will revisit Arthritis Reports knee pain and has been advised to undergo surgery. Currently using a brace for support. -Continue use of knee brace and engage in low-impact exercises like swimming or pilates.  Hyperlipidemia chronic Slightly elevated cholesterol levels. Currently on a low cholesterol diet. -Continue low cholesterol diet and monitor lipid levels. Will follow-up  Menopausal Symptoms On Venlafaxine for hot flashes. -Continue Venlafaxine and ensure timely refills to avoid lapses in medication. Will refill in 1 month and a half Will follow-up  General Health Maintenance -Repeat CBC to monitor platelet levels at her next follow-up Pt declined because of her insurance  coverage -Encouraged to schedule mammogram and lung cancer screening. -Plan for colonoscopy for colorectal cancer screening. -Follow-up in 3 months with labs.      No orders of the defined types were placed in this encounter.   Return in about 3 months (around 11/09/2023) for chronic disease f/u.   The patient was advised to call back or seek an in-person evaluation if the symptoms worsen or if the condition fails to improve as anticipated.  I discussed the assessment and treatment plan with the patient. The patient was provided an opportunity to ask questions  and all were answered. The patient agreed with the plan and demonstrated an understanding of the instructions.  I, Debera Lat, [name] have reviewed all documentation for this visit. The documentation on 08/09/2023  for the exam, diagnosis, procedures, and orders are all accurate and complete.  Debera Lat, [facility], MMS [facility] [phone] (phone) [phone] (fax)  [facility]

## 2023-08-13 ENCOUNTER — Encounter: Payer: Self-pay | Admitting: *Deleted

## 2023-10-23 ENCOUNTER — Other Ambulatory Visit: Payer: Self-pay | Admitting: Physician Assistant

## 2023-10-23 DIAGNOSIS — N951 Menopausal and female climacteric states: Secondary | ICD-10-CM

## 2023-11-09 ENCOUNTER — Ambulatory Visit: Admitting: Physician Assistant

## 2023-11-09 ENCOUNTER — Encounter: Payer: Self-pay | Admitting: Physician Assistant

## 2023-11-09 VITALS — BP 120/92 | HR 63 | Resp 16 | Ht 65.0 in | Wt 167.0 lb

## 2023-11-09 DIAGNOSIS — F1010 Alcohol abuse, uncomplicated: Secondary | ICD-10-CM | POA: Diagnosis not present

## 2023-11-09 DIAGNOSIS — E538 Deficiency of other specified B group vitamins: Secondary | ICD-10-CM

## 2023-11-09 DIAGNOSIS — Z720 Tobacco use: Secondary | ICD-10-CM | POA: Diagnosis not present

## 2023-11-09 DIAGNOSIS — E663 Overweight: Secondary | ICD-10-CM | POA: Diagnosis not present

## 2023-11-09 DIAGNOSIS — E7849 Other hyperlipidemia: Secondary | ICD-10-CM | POA: Insufficient documentation

## 2023-11-09 DIAGNOSIS — M328 Other forms of systemic lupus erythematosus: Secondary | ICD-10-CM | POA: Insufficient documentation

## 2023-11-09 DIAGNOSIS — M1990 Unspecified osteoarthritis, unspecified site: Secondary | ICD-10-CM | POA: Insufficient documentation

## 2023-11-09 DIAGNOSIS — I10 Essential (primary) hypertension: Secondary | ICD-10-CM | POA: Insufficient documentation

## 2023-11-09 DIAGNOSIS — N951 Menopausal and female climacteric states: Secondary | ICD-10-CM

## 2023-11-09 NOTE — Progress Notes (Signed)
 Established patient visit  Patient: Victoria [name]   DOB: 12/05/1975   48 y.o. Female  MRN: [id number] Visit Date: 11/09/2023  Today's healthcare provider: Blane Bunting, [name]   Chief Complaint  Patient presents with   Follow-up    3 mth f/u  no other  concerns   Subjective     HPI     Follow-up    Additional comments: 3 mth f/u  no other  concerns      Last edited by Estill Hemming, CMA on 11/09/2023  8:50 AM.       Discussed the use of AI scribe software for clinical note transcription with the patient, who gave verbal consent to proceed.  History of Present Illness Victoria [name] is a 48 year old female with hypertension and prediabetes who presents for a follow-up visit.  She has lost over 20 pounds in the past three months due to a low-carb diet. Her blood pressure is higher in the morning than in the evening. She takes valsartan  40 mg for hypertension and limits salt intake. She denies leg swelling, chest pain, or rapid heart rate.  She smokes one pack of cigarettes per day and consumes alcohol occasionally at night. She experiences occasional shortness of breath, likely related to smoking.  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 She sees a rheumatologist annually for lupus.  She has low vitamin B12 levels and experiences intermittent tingling and pain in the middle three toes when standing for prolonged periods. She also has carpal tunnel syndrome, causing occasional tingling in her hands.  She takes venlafaxine  for hot flashes, which also helps with anxiety. She denies significant depression or anxiety outside of the norm.  Her family history includes diabetes, and she was told she was prediabetic at her last visit. She is concerned about her cholesterol levels and plans to have blood work done to reassess her lipid profile and A1c levels.  The 10-year ASCVD risk score (Arnett DK, et  al., 2019) is: 2.8%      11/09/2023    9:18 AM 08/09/2023    9:27 AM 07/01/2023    9:17 AM  Depression screen PHQ 2/9  Decreased Interest 1 1 1   Down, Depressed, Hopeless 0 0 1  PHQ - 2 Score 1 1 2   Altered sleeping 2 1 2   Tired, decreased energy 2 2 3   Change in appetite 0 0 0  Feeling bad or failure about yourself  0 0 0  Trouble concentrating 0 1 1  Moving slowly or fidgety/restless 0 0 0  Suicidal thoughts 0 0 0  PHQ-9 Score 5 5 8   Difficult doing work/chores Not difficult at all Not difficult at all Not difficult at all      11/09/2023    9:18 AM 08/09/2023    9:28 AM 07/01/2023    9:17 AM 06/16/2023    2:11 PM  GAD 7 : Generalized Anxiety Score  Nervous, Anxious, on Edge 1 1 2 2   Control/stop worrying 0 1 2 1   Worry too much - different things 0 1 2 1   Trouble relaxing 0 0 0 0  Restless 0 0 0 0  Easily annoyed or irritable 1 1 3 2   Afraid - awful might happen 0 0 0 0  Total GAD 7 Score 2 4 9 6   Anxiety Difficulty Not difficult at all Not difficult at all Not difficult at all Not difficult at all  Medications: Outpatient Medications Prior to Visit  Medication Sig   valsartan  (DIOVAN ) 40 MG tablet Take 1 tablet (40 mg total) by mouth daily.   venlafaxine  XR (EFFEXOR -XR) 37.5 MG 24 hr capsule TAKE 2 CAPSULES BY MOUTH ONCE DAILY WITH BREAKFAST. PLEASE SCHEDULE FOLLOW UP APPOINTMENT   meloxicam (MOBIC) 15 MG tablet Take 15 mg by mouth daily. (Patient not taking: Reported on 11/09/2023)   No facility-administered medications prior to visit.    Review of Systems All negative Except see HPI       Objective    BP (!) 120/92 (BP Location: Left Arm, Patient Position: Sitting)   Pulse 63   Resp 16   Ht 5\' 5"  (1.651 m)   Wt 167 lb (75.8 kg)   SpO2 100%   BMI 27.79 kg/m     Physical Exam Vitals reviewed.  Constitutional:      General: She is not in acute distress.    Appearance: Normal appearance. She is well-developed. She is not diaphoretic.  HENT:      Head: Normocephalic and atraumatic.  Eyes:     General: No scleral icterus.    Conjunctiva/sclera: Conjunctivae normal.  Neck:     Thyroid: No thyromegaly.  Cardiovascular:     Rate and Rhythm: Normal rate and regular rhythm.     Pulses: Normal pulses.     Heart sounds: Normal heart sounds. No murmur heard. Pulmonary:     Effort: Pulmonary effort is normal. No respiratory distress.     Breath sounds: Normal breath sounds. No wheezing, rhonchi or rales.  Musculoskeletal:     Cervical back: Neck supple.     Right lower leg: No edema.     Left lower leg: No edema.  Lymphadenopathy:     Cervical: No cervical adenopathy.  Skin:    General: Skin is warm and dry.     Findings: No rash.  Neurological:     Mental Status: She is alert and oriented to person, place, and time. Mental status is at baseline.  Psychiatric:        Mood and Affect: Mood normal.        Behavior: Behavior normal.      No results found for any visits on 11/09/23.      Assessment and Plan Assessment & Plan Hypertension Blood pressure managed with valsartan  40 mg. Morning readings higher than evening. No leg edema. Advised low-salt diet due to salt's influence on primary hypertension. - Continue valsartan  40 mg. - Follow low-salt diet. - Monitor blood pressure at home. - Re-evaluate blood pressure management in 3 months.  Weight loss Lost over 20 pounds with low-carb diet, benefiting overall health including blood pressure and cholesterol. - Continue low-carb diet.  Hyperlipidemia LDL cholesterol slightly elevated at 101 mg/dL. Weight loss may improve lipid profile. Low risk for myocardial infarction and stroke at 2.8%. - Order lipid panel. - Re-evaluate lipid levels in 3 months.  Prediabetes Family history of prediabetes. Previous A1c indicated prediabetes. - Order hemoglobin A1c test. - Re-evaluate glucose levels in 3 months.  Vitamin B12 deficiency Low vitamin B12 causing macrocytic anemia and  tingling in toes. Emphasized importance of supplements to prevent neurological symptoms. - Start over-the-counter vitamin B12 1000 mcg daily.  Osteoarthritis of the knee Knee pain related to osteoarthritis. 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 - Plan for knee replacement surgery after June 30th.  Lupus Sees rheumatologist annually. Condition may relate to arthritis symptoms. - Continue annual rheumatology follow-up.  Smoking Smokes  one pack per day, contributing to dyspnea and increased cardiovascular risk. - Discuss smoking cessation options at next visit.  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   The patient was advised to call back or seek an in-person evaluation if the symptoms worsen or if the condition fails to improve as anticipated.  I discussed the assessment and treatment plan with the patient. The patient was provided an opportunity to ask questions and all were answered. The patient agreed with the  plan and demonstrated an understanding of the instructions.  I, Deijah Spikes, [name] have reviewed all documentation for this visit. The documentation on 11/09/2023  for the exam, diagnosis, procedures, and orders are all accurate and complete.  Blane Bunting, [facility], MMS [facility] [phone] (phone) [phone] (fax)  [facility]

## 2024-01-26 ENCOUNTER — Other Ambulatory Visit: Payer: Self-pay | Admitting: Physician Assistant

## 2024-01-26 DIAGNOSIS — N951 Menopausal and female climacteric states: Secondary | ICD-10-CM

## 2024-01-28 ENCOUNTER — Encounter: Payer: Self-pay | Admitting: Physician Assistant

## 2024-02-10 ENCOUNTER — Ambulatory Visit: Admitting: Physician Assistant

## 2024-04-29 ENCOUNTER — Other Ambulatory Visit: Payer: Self-pay | Admitting: Physician Assistant

## 2024-04-29 DIAGNOSIS — N951 Menopausal and female climacteric states: Secondary | ICD-10-CM

## 2024-04-29 DIAGNOSIS — I10 Essential (primary) hypertension: Secondary | ICD-10-CM

## 2024-05-01 NOTE — Telephone Encounter (Signed)
 LOV 11/09/23 NOV no new OV scheduled LRF 02/01/24 qty:180 r:0 venlafaxine  XR (EFFEXOR -XR) 37.5 MG 24 hr   A message has been sent regarding her pending blood work from June. A courtesy refill on Valsartan  has been sent in.

## 2024-05-02 ENCOUNTER — Other Ambulatory Visit: Payer: Self-pay | Admitting: Physician Assistant

## 2024-05-02 DIAGNOSIS — N951 Menopausal and female climacteric states: Secondary | ICD-10-CM

## 2024-05-02 NOTE — Telephone Encounter (Signed)
 Spoke with Ranny from [facility]. Medication was changed instead of 37.5 BID insurance will cover only 75mg  1 capsule daily, she had to override and a PA code was given and has now been approved by insurance with those changes.

## 2024-05-02 NOTE — Telephone Encounter (Signed)
Needs an appointment before the next refill.

## 2024-05-27 ENCOUNTER — Other Ambulatory Visit: Payer: Self-pay | Admitting: Physician Assistant

## 2024-05-27 DIAGNOSIS — I10 Essential (primary) hypertension: Secondary | ICD-10-CM

## 2024-05-31 ENCOUNTER — Other Ambulatory Visit: Payer: Self-pay | Admitting: Physician Assistant

## 2024-05-31 DIAGNOSIS — I10 Essential (primary) hypertension: Secondary | ICD-10-CM
